# Patient Record
Sex: Female | Born: 1980 | Race: White | Hispanic: No | Marital: Married | Smoking: Former smoker
Health system: Southern US, Community
[De-identification: ages and names within clinical notes are randomized; demographics above are authoritative.]

## PROBLEM LIST (undated history)

## (undated) DIAGNOSIS — K219 Gastro-esophageal reflux disease without esophagitis: Secondary | ICD-10-CM

## (undated) DIAGNOSIS — Z8619 Personal history of other infectious and parasitic diseases: Secondary | ICD-10-CM

## (undated) DIAGNOSIS — T8859XA Other complications of anesthesia, initial encounter: Secondary | ICD-10-CM

## (undated) DIAGNOSIS — M1990 Unspecified osteoarthritis, unspecified site: Secondary | ICD-10-CM

## (undated) DIAGNOSIS — T4145XA Adverse effect of unspecified anesthetic, initial encounter: Secondary | ICD-10-CM

## (undated) DIAGNOSIS — Z1589 Genetic susceptibility to other disease: Secondary | ICD-10-CM

## (undated) DIAGNOSIS — E282 Polycystic ovarian syndrome: Secondary | ICD-10-CM

## (undated) DIAGNOSIS — Z8489 Family history of other specified conditions: Secondary | ICD-10-CM

## (undated) DIAGNOSIS — R51 Headache: Secondary | ICD-10-CM

## (undated) DIAGNOSIS — R519 Headache, unspecified: Secondary | ICD-10-CM

## (undated) DIAGNOSIS — J45909 Unspecified asthma, uncomplicated: Secondary | ICD-10-CM

## (undated) DIAGNOSIS — G4730 Sleep apnea, unspecified: Secondary | ICD-10-CM

## (undated) HISTORY — DX: Personal history of other infectious and parasitic diseases: Z86.19

## (undated) HISTORY — DX: Unspecified asthma, uncomplicated: J45.909

## (undated) HISTORY — DX: Gastro-esophageal reflux disease without esophagitis: K21.9

## (undated) HISTORY — DX: Unspecified osteoarthritis, unspecified site: M19.90

## (undated) HISTORY — PX: REFRACTIVE SURGERY: SHX103

## (undated) HISTORY — DX: Sleep apnea, unspecified: G47.30

---
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
## 2014-12-31 ENCOUNTER — Other Ambulatory Visit: Payer: Self-pay

## 2014-12-31 DIAGNOSIS — Z01818 Encounter for other preprocedural examination: Secondary | ICD-10-CM

## 2014-12-31 DIAGNOSIS — G4733 Obstructive sleep apnea (adult) (pediatric): Secondary | ICD-10-CM

## 2015-02-09 ENCOUNTER — Encounter: Payer: 59 | Attending: Surgery | Admitting: Dietician

## 2015-02-09 ENCOUNTER — Encounter: Payer: Self-pay | Admitting: Dietician

## 2015-02-09 DIAGNOSIS — Z6841 Body Mass Index (BMI) 40.0 and over, adult: Secondary | ICD-10-CM | POA: Diagnosis not present

## 2015-02-09 DIAGNOSIS — Z713 Dietary counseling and surveillance: Secondary | ICD-10-CM | POA: Diagnosis not present

## 2015-02-09 NOTE — Progress Notes (Signed)
  Pre-Op Assessment Visit:  Pre-Operative Sleeve Gastrectomy or RYGB Surgery  Medical Nutrition Therapy:  Appt start time: 1000   End time:  1030.  Patient was seen on 02/09/2015 for Pre-Operative Nutrition Assessment. Assessment and letter of approval faxed to [facility] Bariatric Surgery Program coordinator on 02/09/2015.   Preferred Learning Style:   No preference indicated   Learning Readiness:   Ready  Handouts given during visit include:  Pre-Op Goals Bariatric Surgery Protein Shakes   During the appointment today the following Pre-Op Goals were reviewed with the patient: Maintain or lose weight as instructed by your surgeon Make healthy food choices Begin to limit portion sizes Limited concentrated sugars and fried foods Keep fat/sugar in the single digits per serving on   food labels Practice CHEWING your food  (aim for 30 chews per bite or until applesauce consistency) Practice not drinking 15 minutes before, during, and 30 minutes after each meal/snack Avoid all carbonated beverages  Avoid/limit caffeinated beverages  Avoid all sugar-sweetened beverages Consume 3 meals per day; eat every 3-5 hours Make a list of non-food related activities Aim for 64-100 ounces of FLUID daily  Aim for at least 60-80 grams of PROTEIN daily Look for a liquid protein source that contain ?15 g protein and ?5 g carbohydrate  (ex: shakes, drinks, shots)  Patient-Centered Goals: -More active for kids  Scale of 1-10: confidence(6)/importance (10)  Demonstrated degree of understanding via:  Teach Back  Teaching Method Utilized:  Visual Auditory Hands on  Barriers to learning/adherence to lifestyle change: none  [facility] to call the [facility] to enroll in Pre-Op and Post-Op Nutrition Education when surgery date is scheduled.

## 2015-02-09 NOTE — Patient Instructions (Signed)

## 2015-02-11 ENCOUNTER — Ambulatory Visit (HOSPITAL_COMMUNITY)
Admission: RE | Admit: 2015-02-11 | Discharge: 2015-02-11 | Disposition: A | Discharge status: Home / Self Care | Payer: 59 | Source: Ambulatory Visit | Attending: Surgery | Admitting: Surgery

## 2015-02-11 ENCOUNTER — Other Ambulatory Visit: Payer: Self-pay

## 2015-02-11 DIAGNOSIS — K219 Gastro-esophageal reflux disease without esophagitis: Secondary | ICD-10-CM | POA: Diagnosis not present

## 2015-02-11 DIAGNOSIS — G4733 Obstructive sleep apnea (adult) (pediatric): Secondary | ICD-10-CM | POA: Insufficient documentation

## 2015-02-11 DIAGNOSIS — J45909 Unspecified asthma, uncomplicated: Secondary | ICD-10-CM | POA: Diagnosis not present

## 2015-02-11 DIAGNOSIS — R161 Splenomegaly, not elsewhere classified: Secondary | ICD-10-CM | POA: Diagnosis not present

## 2015-02-11 DIAGNOSIS — K760 Fatty (change of) liver, not elsewhere classified: Secondary | ICD-10-CM | POA: Insufficient documentation

## 2015-02-11 DIAGNOSIS — F418 Other specified anxiety disorders: Secondary | ICD-10-CM | POA: Insufficient documentation

## 2015-02-11 DIAGNOSIS — Z87891 Personal history of nicotine dependence: Secondary | ICD-10-CM | POA: Insufficient documentation

## 2015-02-11 DIAGNOSIS — Z6841 Body Mass Index (BMI) 40.0 and over, adult: Secondary | ICD-10-CM | POA: Diagnosis not present

## 2015-03-17 ENCOUNTER — Encounter: Payer: 59 | Attending: Surgery | Admitting: Dietician

## 2015-03-17 ENCOUNTER — Encounter: Payer: Self-pay | Admitting: Dietician

## 2015-03-17 DIAGNOSIS — Z6841 Body Mass Index (BMI) 40.0 and over, adult: Secondary | ICD-10-CM | POA: Insufficient documentation

## 2015-03-17 DIAGNOSIS — Z713 Dietary counseling and surveillance: Secondary | ICD-10-CM | POA: Diagnosis not present

## 2015-03-17 NOTE — Progress Notes (Signed)
  6 Months Supervised Weight Loss Visit:   Pre-Operative sleeve or RYGB Surgery  Medical Nutrition Therapy:  Appt start time: 1610 end time:  1625.  Primary concerns today: Supervised Weight Loss Visit. Returned with a 5 lbs weight gain since last time. Has had some vacations in the past month. Has tried some protein powder and got some vitamins. Starting to eat 3 meals per day instead of one large meal. Still having fried/sugar in foods. Drinks water, coffee black or with cream, diet soda, and unsweet tea. Has started working on chewing well and not drinking during meals.   Weight: 308.9 lbs BMI: 48.4  Patient-Centered Goals: -More active for kids  Scale of 1-10: confidence(6)/importance (10)  Preferred Learning Style:   No preference indicated   Learning Readiness:   Ready  Recent physical activity:  Going to the gym 3 x week (swimming or weight lifting or walking) for 60 minutes  Progress Towards Goal(s):  In progress.    Nutritional Diagnosis:  [place]-3.3 Obesity related to past poor dietary habits and physical inactivity as evidenced by patient attending supervised weight loss for insurance approval of bariatric surgery.    Intervention:  Nutrition counseling providing. Plan: Plan to cut back on diet soda. Plan to cut back on pasta, fried foods, and sugar. Continue to work on chewing well and not drinking during meals.  Try Premier Protein.   Teaching Method Utilized:  Visual Auditory Hands on  Barriers to learning/adherence to lifestyle change: none  Demonstrated degree of understanding via:  Teach Back   Monitoring/Evaluation:  Dietary intake, exercise, and body weight. Follow up in 1 months for 6 month supervised weight loss visit.

## 2015-03-17 NOTE — Patient Instructions (Addendum)
Plan to cut back on diet soda. Plan to cut back on pasta, fried foods, and sugar. Continue to work on chewing well and not drinking during meals.  Try Premier Protein.

## 2015-03-30 ENCOUNTER — Ambulatory Visit: Payer: 59 | Admitting: Family

## 2015-03-31 ENCOUNTER — Encounter: Payer: Self-pay | Admitting: Family

## 2015-03-31 ENCOUNTER — Ambulatory Visit (INDEPENDENT_AMBULATORY_CARE_PROVIDER_SITE_OTHER): Payer: 59 | Admitting: Family

## 2015-03-31 VITALS — BP 120/82 | HR 83 | Temp 97.9°F | Resp 18 | Ht 67.5 in | Wt 304.6 lb

## 2015-03-31 DIAGNOSIS — Z0000 Encounter for general adult medical examination without abnormal findings: Secondary | ICD-10-CM

## 2015-03-31 DIAGNOSIS — J45990 Exercise induced bronchospasm: Secondary | ICD-10-CM

## 2015-03-31 DIAGNOSIS — R635 Abnormal weight gain: Secondary | ICD-10-CM | POA: Diagnosis not present

## 2015-03-31 DIAGNOSIS — F329 Major depressive disorder, single episode, unspecified: Secondary | ICD-10-CM

## 2015-03-31 DIAGNOSIS — E282 Polycystic ovarian syndrome: Secondary | ICD-10-CM | POA: Diagnosis not present

## 2015-03-31 DIAGNOSIS — F419 Anxiety disorder, unspecified: Secondary | ICD-10-CM

## 2015-03-31 DIAGNOSIS — F418 Other specified anxiety disorders: Secondary | ICD-10-CM

## 2015-03-31 DIAGNOSIS — G4733 Obstructive sleep apnea (adult) (pediatric): Secondary | ICD-10-CM

## 2015-03-31 DIAGNOSIS — G43809 Other migraine, not intractable, without status migrainosus: Secondary | ICD-10-CM

## 2015-03-31 LAB — BASIC METABOLIC PANEL
BUN: 11 mg/dL (ref 6–23)
CO2: 30 mEq/L (ref 19–32)
CREATININE: 0.74 mg/dL (ref 0.40–1.20)
Calcium: 9.8 mg/dL (ref 8.4–10.5)
Chloride: 101 mEq/L (ref 96–112)
GFR: 96.7 mL/min (ref >60.00)
Glucose, Bld: 94 mg/dL (ref 70–99)
Potassium: 4 mEq/L (ref 3.5–5.1)
Sodium: 136 mEq/L (ref 135–145)

## 2015-03-31 LAB — TSH: TSH: 2.42 u[IU]/mL (ref 0.35–4.50)

## 2015-03-31 LAB — HEMOGLOBIN A1C: HEMOGLOBIN A1C: 5.1 % (ref 4.6–6.5)

## 2015-03-31 MED ORDER — METFORMIN HCL 500 MG PO TABS
500.0000 mg | ORAL_TABLET | Freq: Every day | ORAL | Status: DC
Start: 2015-03-31 — End: 2016-05-25

## 2015-03-31 MED ORDER — ALBUTEROL SULFATE HFA 108 (90 BASE) MCG/ACT IN AERS: 2.0000 | INHALATION_SPRAY | Freq: Four times a day (QID) | RESPIRATORY_TRACT | Status: AC | PRN

## 2015-03-31 NOTE — Patient Instructions (Addendum)
Please complete lab work prior to leaving. Schedule a complete physical at the front desk. Welcome to [place]! 

## 2015-03-31 NOTE — Progress Notes (Signed)
Subjective:    Patient ID: Kaylee Roberts, female    DOB: 04/30/1981, 34 y.o.   MRN: [id number]  HPI  Kaylee Roberts is a 34 yr old female who presents today to establish care.  Anxiety/depression- moved from [place] in November. Notes that anxiety > depression. Was treated with prozac post partum.    OSA- She does not have a CPAP, was intolerant.    Migraine- reports monthly migraines- seems to be worse with rainey weather.  Uses excedrin migraine which usually helps.    Asthma- Exercise induced, uses albuterol prn.   PCOS- not having menstrual cycles.    Morbid obesity- planning gastric bypass.   She reports that she had strep in May. Took abx x 10 days.  Tonsils feel full since that time.  Occasional tonsilar irritation.   Review of Systems  Constitutional:       Has gained 20 pounds in the last 6 months  HENT: Negative for hearing loss and rhinorrhea.   Eyes: Negative for visual disturbance.  Respiratory: Negative for cough and shortness of breath.   Cardiovascular: Negative for chest pain.  Gastrointestinal: Negative for diarrhea and constipation.  Genitourinary: Negative for dysuria and frequency.  Musculoskeletal: Negative for myalgias.       Reports arthitis in knees and ankles  Skin: Negative for rash.  Neurological: Positive for headaches.  Hematological: Negative for adenopathy.  Psychiatric/Behavioral:       See HPI   Past Medical History  Diagnosis Date  . Anxiety   . Depression   . GERD (gastroesophageal reflux disease)   . Sleep apnea   . Asthma     exercise induced asthma  . History of chicken pox   . Osteoarthritis   . History of migraine     Social History   Social History  . Marital Status: Married    Spouse Name: N/A  . Number of Children: N/A  . Years of Education: N/A   Occupational History  . Not on file.   Social History Main Topics  . Smoking status: Never Smoker   . Smokeless tobacco: Not on file  . Alcohol Use: No  . Drug  Use: No  . Sexual Activity: Not on file   Other Topics Concern  . Not on file   Social History Narrative   2 sons born 2010 and 2013   Stay at home mom   Married   Enjoys painting/crafting, spending time with friends.  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    Completed college    Past Surgical History  Procedure Laterality Date  . Cesarean section  2010 & 2013  . Fracture surgery  1990    nasal    Family History  Problem Relation Age of Onset  . Arthritis Mother   . Diabetes Mother   . Hypertension Father   . Bleeding Disorder Sister   . Depression Sister   . Arthritis Maternal Grandmother   . Stroke Maternal Grandmother   . Alcohol abuse Maternal Grandfather   . Diabetes Maternal Grandfather   . Hyperlipidemia Paternal Grandmother   . Heart disease Paternal Grandmother   . Hypertension Paternal Grandmother   . Hyperlipidemia Paternal Grandfather   . Heart disease Paternal Grandfather   . Hypertension Paternal Grandfather     Allergies  Allergen Reactions  . Allegra [Fexofenadine] Palpitations and Rash  . Claritin [Loratadine] Palpitations and Rash  . Tetracyclines & Related Palpitations and Rash    Current Outpatient Prescriptions on File Prior to  Visit  Medication Sig Dispense Refill  . metFORMIN (GLUCOPHAGE) 500 MG tablet Take by mouth daily with breakfast.      No current facility-administered medications on file prior to visit.    BP 120/82 mmHg  Pulse 83  Temp(Src) 97.9 F (36.6 C) (Oral)  Resp 18  Ht 5' 7.5" (1.715 m)  Wt 304 lb 9.6 oz (138.166 kg)  BMI 46.98 kg/m2  SpO2 98%  LMP 02/01/2015       Objective:   Physical Exam  Constitutional: She is oriented to person, place, and time. She appears well-developed and well-nourished.  Morbidly obese white female  HENT:  Head: Normocephalic and atraumatic.  Right Ear: Tympanic membrane and ear canal normal.  Left Ear: Tympanic membrane and ear canal normal.  Mouth/Throat: No oropharyngeal  exudate, posterior oropharyngeal edema, posterior oropharyngeal erythema or tonsillar abscesses.  2+ tonsils  Cardiovascular: Normal rate, regular rhythm and normal heart sounds.   No murmur heard. Pulmonary/Chest: Effort normal and breath sounds normal. No respiratory distress. She has no wheezes.  Musculoskeletal: She exhibits no edema.  Neurological: She is alert and oriented to person, place, and time.  Skin: Skin is warm and dry.  Psychiatric: She has a normal mood and affect. Her behavior is normal. Judgment and thought content normal.          Assessment & Plan:  Weight Gain- obtain TSH

## 2015-04-02 ENCOUNTER — Encounter: Payer: Self-pay | Admitting: Family

## 2015-04-02 DIAGNOSIS — F32A Depression, unspecified: Secondary | ICD-10-CM | POA: Insufficient documentation

## 2015-04-02 DIAGNOSIS — E282 Polycystic ovarian syndrome: Secondary | ICD-10-CM | POA: Insufficient documentation

## 2015-04-02 DIAGNOSIS — G4733 Obstructive sleep apnea (adult) (pediatric): Secondary | ICD-10-CM | POA: Insufficient documentation

## 2015-04-02 DIAGNOSIS — J45990 Exercise induced bronchospasm: Secondary | ICD-10-CM | POA: Insufficient documentation

## 2015-04-02 DIAGNOSIS — F419 Anxiety disorder, unspecified: Secondary | ICD-10-CM

## 2015-04-02 DIAGNOSIS — G43909 Migraine, unspecified, not intractable, without status migrainosus: Secondary | ICD-10-CM | POA: Insufficient documentation

## 2015-04-02 DIAGNOSIS — F329 Major depressive disorder, single episode, unspecified: Secondary | ICD-10-CM | POA: Insufficient documentation

## 2015-04-02 NOTE — Assessment & Plan Note (Signed)
Stable prn excedrin migraine

## 2015-04-02 NOTE — Assessment & Plan Note (Signed)
Stable with prn albuterol.  

## 2015-04-02 NOTE — Assessment & Plan Note (Addendum)
Attempted CPAP and was intolerant. Wishes to focus on weight loss

## 2015-04-02 NOTE — Assessment & Plan Note (Signed)
She does not wish to start any medication at this time.  Monitor.

## 2015-04-02 NOTE — Assessment & Plan Note (Signed)
Amenorrhea, will refer to GYN. Obtain A1C, continue metformin.

## 2015-04-02 NOTE — Assessment & Plan Note (Signed)
She is seeking bariatric surgery and hopes to have done in a few months.

## 2015-04-15 ENCOUNTER — Ambulatory Visit: Payer: 59 | Admitting: Dietician

## 2015-04-19 ENCOUNTER — Encounter: Payer: Self-pay | Admitting: Dietician

## 2015-04-19 ENCOUNTER — Encounter: Payer: 59 | Attending: Surgery | Admitting: Dietician

## 2015-04-19 DIAGNOSIS — Z713 Dietary counseling and surveillance: Secondary | ICD-10-CM | POA: Insufficient documentation

## 2015-04-19 DIAGNOSIS — Z6841 Body Mass Index (BMI) 40.0 and over, adult: Secondary | ICD-10-CM | POA: Insufficient documentation

## 2015-04-19 NOTE — Patient Instructions (Addendum)
Continue to cut back on diet soda. Continue cutting out caffeine. Continue to cut back on carbs. Continue to work on chewing well and not drinking during meals.  Try Premier Protein. Think about packing (or powder) for vacation. Continue exercising about 3 x week.

## 2015-04-19 NOTE — Progress Notes (Signed)
  6 Months Supervised Weight Loss Visit:   Pre-Operative sleeve or RYGB Surgery  Medical Nutrition Therapy:  Appt start time: 1430 end time:  1445.  Primary concerns today: Supervised Weight Loss Visit #2. Returned with a 5 lbs weight loss since last time. Getting ready to go to [place] on vacation. Cut back on soda a lot. Working on cutting back on eating at night. Still having 3 meals a day. Has cut back some on coffee. Has cut on fried food for the most back. Doesn't have too much sugar.   Has started working on chewing well and not drinking during meals.   Weight: 308.9 lbs BMI: 48.4  Patient-Centered Goals: -More active for kids  Scale of 1-10: confidence(6)/importance (10)  Preferred Learning Style:   No preference indicated   Learning Readiness:   Ready  Recent physical activity:  Going to the gym 3 x week (swimming or weight lifting or walking) for 60 minutes  Progress Towards Goal(s):  In progress.    Nutritional Diagnosis:  [place]-3.3 Obesity related to past poor dietary habits and physical inactivity as evidenced by patient attending supervised weight loss for insurance approval of bariatric surgery.    Intervention:  Nutrition counseling providing. Plan: Continue to cut back on diet soda. Continue cutting out caffeine. Continue to cut back on carbs. Continue to work on chewing well and not drinking during meals.  Try Premier Protein. Think about packing (or powder) for vacation. Continue exercising about 3 x week.   Teaching Method Utilized:  Visual Auditory Hands on  Barriers to learning/adherence to lifestyle change: none  Demonstrated degree of understanding via:  Teach Back   Monitoring/Evaluation:  Dietary intake, exercise, and body weight. Follow up in 1 months for 6 month supervised weight loss visit.

## 2015-05-17 ENCOUNTER — Encounter: Payer: Self-pay | Admitting: Dietician

## 2015-05-17 ENCOUNTER — Encounter: Payer: 59 | Attending: Surgery | Admitting: Dietician

## 2015-05-17 ENCOUNTER — Ambulatory Visit: Payer: 59 | Admitting: Dietician

## 2015-05-17 DIAGNOSIS — Z6841 Body Mass Index (BMI) 40.0 and over, adult: Secondary | ICD-10-CM | POA: Insufficient documentation

## 2015-05-17 DIAGNOSIS — Z713 Dietary counseling and surveillance: Secondary | ICD-10-CM | POA: Diagnosis not present

## 2015-05-17 NOTE — Progress Notes (Signed)
Patient was seen on 05/17/2015 for the "Mindful Eating" Supervised Weight Loss Class at the [facility]. The following learning objectives were met by the patient during this class:  Objectives: -Discuss masticating foods to appropriate consistency  -Helps food fit in pouch and prevents pain and vomiting  -Eating slowly and taking tiny bites help brain register fullness -Encourage patients to begin to think about what diet will be like after surgery -Encourage patients to begin to consider portion sizes and listen to hunger/fullness cues -Review importance of regular meals and snacks -Discuss diet advancement after surgery -Review causes and symptoms of dumping syndrome  Goals: -Begin limiting fried and other high fat foods and foods high in sugar -Eat proteins first, then vegetables, then starch -Practice chewing foods to applesauce consistency -Aim to spend 20 minutes eating meals -Work on eating 3 meals a day (or every 3-5 hours) if you are not already doing so  Handouts given: "Mindful Eating" pamphlet 

## 2015-06-14 ENCOUNTER — Encounter: Payer: 59 | Attending: Surgery | Admitting: Dietician

## 2015-06-14 DIAGNOSIS — Z713 Dietary counseling and surveillance: Secondary | ICD-10-CM | POA: Diagnosis not present

## 2015-06-14 DIAGNOSIS — Z6841 Body Mass Index (BMI) 40.0 and over, adult: Secondary | ICD-10-CM | POA: Diagnosis not present

## 2015-06-15 ENCOUNTER — Encounter: Payer: Self-pay | Admitting: Dietician

## 2015-06-15 NOTE — Progress Notes (Signed)
Supervised Weight Loss Class:  Appt start time: 1600   End time:  1630.  Patient was seen on 06/14/2015 for the "Protein" Supervised Weight Loss Class at the [facility].   Surgery type:  Start weight at [facility]: 303 lbs on 02/09/15 Weight today: 303.3 lbs Weight change: 0 lbs   The following learning objectives were met by the patient during this class:  Objectives: -Discuss importance of protein after surgery  -Promotes healing  -Helps preserve lean mass (muscles and organs) as you lose fat -Identify foods that contain protein -Discuss recommended daily protein goals for men and women after surgery -Educate patients on body composition scale and purpose   Goals: -Begin to be mindful of how much protein is in the foods you are eating -Know how much protein is recommended daily after surgery -Begin having a protein food with each meal and snack  Handouts given: "Protein" brochure

## 2015-07-19 ENCOUNTER — Encounter: Payer: 59 | Attending: Surgery | Admitting: Dietician

## 2015-07-19 DIAGNOSIS — Z713 Dietary counseling and surveillance: Secondary | ICD-10-CM | POA: Diagnosis not present

## 2015-07-19 DIAGNOSIS — Z6841 Body Mass Index (BMI) 40.0 and over, adult: Secondary | ICD-10-CM | POA: Insufficient documentation

## 2015-07-21 ENCOUNTER — Encounter: Payer: Self-pay | Admitting: Dietician

## 2015-07-21 NOTE — Progress Notes (Signed)
Supervised Weight Loss Class:  Appt start time: 1600   End time:  1630.  Patient was seen on 07/19/2015 for the "Protein Shakes" Supervised Weight Loss Class at the [facility].    Surgery type:  Start weight at [facility]: 303 lbs on 02/09/15 Weight today: 304.9 lbs Weight change: 1.6 lbs gain  The following learning objectives were met by the patient during this class:  Objectives: -Review when protein shakes will be recommended in the surgery process -Identify criteria for acceptable protein shakes -Provide examples of acceptable and unacceptable protein shakes -Remind patients that taste preferences may change after surgery  Goals: -Find 1 or more protein shakes that meet the criteria  -Look for a shake that is acceptable for taste and budget  Handouts given: Protein shakes brochure

## 2015-08-16 ENCOUNTER — Encounter: Payer: 59 | Attending: Surgery | Admitting: Dietician

## 2015-08-16 DIAGNOSIS — Z713 Dietary counseling and surveillance: Secondary | ICD-10-CM | POA: Diagnosis not present

## 2015-08-16 DIAGNOSIS — Z6841 Body Mass Index (BMI) 40.0 and over, adult: Secondary | ICD-10-CM | POA: Insufficient documentation

## 2015-08-17 ENCOUNTER — Encounter: Payer: Self-pay | Admitting: Dietician

## 2015-08-17 ENCOUNTER — Telehealth: Payer: Self-pay | Admitting: Family

## 2015-08-17 NOTE — Telephone Encounter (Signed)
Caller name: [place]  Relationship to patient: Self  Can be reached: [phone]  Reason for call: Pt called in stating that she need a letter of necessity to provide to her insurance. Pt says that she is getting gastro surgery.

## 2015-08-17 NOTE — Progress Notes (Signed)
Supervised Weight Loss Class:  Appt start time: 1600   End time:  1630.  Patient was seen on 08/16/2015 for the "Fluid" Supervised Weight Loss Class at the [facility].   Surgery type:  Start weight at [facility]: 303 lbs on 02/09/15 Weight today: 304.2 lbs Weight change: 1 lbs loss  Objectives: -Discuss importance of fluid and hydration status after surgery -Identify appropriate fluids after surgery -Discuss importance of limiting caffeine in the perioperative period -Review importance of avoiding drinking while eating  Goals: -Begin to wean off of caffeine -Practice avoiding fluids 15 minutes before, during, and 30 minutes after meals -Begin to work your way up to 64 ounces of sugar free, caffeine free, non-carbonated fluids  Handouts given: "Fluid" handout

## 2015-08-18 ENCOUNTER — Encounter: Payer: Self-pay | Admitting: Family

## 2015-08-18 NOTE — Telephone Encounter (Signed)
See letter.

## 2015-08-18 NOTE — Telephone Encounter (Signed)
Left message for pt to return my call.

## 2015-08-18 NOTE — Telephone Encounter (Signed)
Pt called back stating she has to get approval from insurance before she can proceed with gastric bypass. Reports that she completed 6 months of nutrition classes / counseling through [employer]. Followed low cab diet and stopped all caffeine. Exercised 3 x weekly (1 hr each time), did cardio and weights. Weight remains 304 per pt. Pt needs letter to state health conditions: OSA, PCOS, asthma. Needs to contain BMI and note that pt has not been under PCP's care for 5 years as she is new to this area and just established with [place] this past year. Please advise.

## 2015-08-19 NOTE — Telephone Encounter (Signed)
Left a message making patient aware that letter has been printed and placed up front for pick up.  She was encouraged to call back with questions or concerns.

## 2015-10-04 ENCOUNTER — Encounter: Payer: 59 | Attending: Surgery

## 2015-10-04 DIAGNOSIS — Z713 Dietary counseling and surveillance: Secondary | ICD-10-CM | POA: Diagnosis not present

## 2015-10-04 DIAGNOSIS — Z6841 Body Mass Index (BMI) 40.0 and over, adult: Secondary | ICD-10-CM | POA: Diagnosis not present

## 2015-10-06 NOTE — Progress Notes (Signed)
  Pre-Operative Nutrition Class:  Appt start time: 3709   End time:  1830.  Patient was seen on 10/04/2015 for Pre-Operative Bariatric Surgery Education at the [facility].   Surgery date: 10/26/2015 Surgery type: RYGB Start weight at [facility]: 303 lbs on 02/09/2015 Weight today: 305.5 lbs  TANITA  BODY COMP RESULTS  10/04/15   BMI (kg/m^2) 47.8   Fat Mass (lbs) 159   Fat Free Mass (lbs) 146.5   Total Body Water (lbs) 107   Samples given per MNT protocol. Patient educated on appropriate usage: Unjury protein powder (strawberry - qty 1) Lot #: [id number] Exp: 05/2016  Premier protein shake (vanilla - qty 1) Lot #: [id number] Exp:12/2015  Celebrate Vitamins Multivitamin (black cherry - qty 1) Lot #: [id number] Exp: 02/2017  Bariatric Advantage Calcium Citrate chew (tropical orange - qty 1) Lot #: [id number] Exp: 02/2016  The following the learning objectives were met by the patient during this course:  Identify Pre-Op Dietary Goals and will begin 2 weeks pre-operatively  Identify appropriate sources of fluids and proteins   State protein recommendations and appropriate sources pre and post-operatively  Identify Post-Operative Dietary Goals and will follow for 2 weeks post-operatively  Identify appropriate multivitamin and calcium sources  Describe the need for physical activity post-operatively and will follow MD recommendations  State when to call healthcare provider regarding medication questions or post-operative complications  Handouts given during class include:  Pre-Op Bariatric Surgery Diet Handout  Protein Shake Handout  Post-Op Bariatric Surgery Nutrition Handout  BELT Program Information Flyer  Support Group Information Flyer  [facility] Bariatric Supplements Price List  Follow-Up Plan: Patient will follow-up at [facility] 2 weeks post operatively for diet advancement per MD.

## 2015-10-11 ENCOUNTER — Ambulatory Visit: Payer: Self-pay | Admitting: Surgery

## 2015-10-13 ENCOUNTER — Encounter (HOSPITAL_COMMUNITY): Payer: Self-pay

## 2015-10-13 ENCOUNTER — Encounter (HOSPITAL_COMMUNITY)
Admission: RE | Admit: 2015-10-13 | Discharge: 2015-10-13 | Disposition: A | Discharge status: Home / Self Care | Payer: 59 | Source: Ambulatory Visit | Attending: Surgery | Admitting: Surgery

## 2015-10-13 DIAGNOSIS — Z01812 Encounter for preprocedural laboratory examination: Secondary | ICD-10-CM | POA: Insufficient documentation

## 2015-10-13 HISTORY — DX: Adverse effect of unspecified anesthetic, initial encounter: T41.45XA

## 2015-10-13 HISTORY — DX: Headache, unspecified: R51.9

## 2015-10-13 HISTORY — DX: Headache: R51

## 2015-10-13 HISTORY — DX: Other complications of anesthesia, initial encounter: T88.59XA

## 2015-10-13 HISTORY — DX: Family history of other specified conditions: Z84.89

## 2015-10-13 HISTORY — DX: Polycystic ovarian syndrome: E28.2

## 2015-10-13 HISTORY — DX: Genetic susceptibility to other disease: Z15.89

## 2015-10-13 LAB — COMPREHENSIVE METABOLIC PANEL
ALBUMIN: 4.2 g/dL (ref 3.5–5.0)
ALT: 32 U/L (ref 14–54)
AST: 21 U/L (ref 15–41)
Alkaline Phosphatase: 55 U/L (ref 38–126)
Anion gap: 8 (ref 5–15)
BUN: 11 mg/dL (ref 6–20)
CHLORIDE: 103 mmol/L (ref 101–111)
CO2: 29 mmol/L (ref 22–32)
CREATININE: 0.75 mg/dL (ref 0.44–1.00)
Calcium: 9.7 mg/dL (ref 8.9–10.3)
GFR calc Af Amer: >60 mL/min (ref >60)
GLUCOSE: 123 mg/dL — AB (ref 65–99)
POTASSIUM: 4 mmol/L (ref 3.5–5.1)
SODIUM: 140 mmol/L (ref 135–145)
Total Bilirubin: 0.4 mg/dL (ref 0.3–1.2)
Total Protein: 8.1 g/dL (ref 6.5–8.1)

## 2015-10-13 LAB — CBC WITH DIFFERENTIAL/PLATELET
BASOS ABS: 0 10*3/uL (ref 0.0–0.1)
BASOS PCT: 0 %
EOS ABS: 0.2 10*3/uL (ref 0.0–0.7)
Eosinophils Relative: 2 %
HCT: 42 % (ref 36.0–46.0)
Hemoglobin: 13.3 g/dL (ref 12.0–15.0)
LYMPHS PCT: 31 %
Lymphs Abs: 2.8 10*3/uL (ref 0.7–4.0)
MCH: 26.5 pg (ref 26.0–34.0)
MCHC: 31.7 g/dL (ref 30.0–36.0)
MCV: 83.7 fL (ref 78.0–100.0)
MONO ABS: 0.3 10*3/uL (ref 0.1–1.0)
Monocytes Relative: 3 %
Neutro Abs: 5.9 10*3/uL (ref 1.7–7.7)
Neutrophils Relative %: 64 %
PLATELETS: 280 10*3/uL (ref 150–400)
RBC: 5.02 MIL/uL (ref 3.87–5.11)
RDW: 15.1 % (ref 11.5–15.5)
WBC: 9.3 10*3/uL (ref 4.0–10.5)

## 2015-10-13 NOTE — Patient Instructions (Addendum)
YOUR PROCEDURE IS SCHEDULED ON : 10/26/15  REPORT TO [facility] MAIN ENTRANCE FOLLOW SIGNS TO EAST ELEVATOR - GO TO 3rd FLOOR CHECK IN AT 3 EAST NURSES STATION (SHORT STAY) AT:  5:15 am  CALL THIS NUMBER IF YOU HAVE PROBLEMS THE MORNING OF SURGERY [phone]  REMEMBER:ONLY 1 PER PERSON MAY GO TO SHORT STAY WITH YOU TO GET READY THE MORNING OF YOUR SURGERY  DO NOT EAT FOOD OR DRINK LIQUIDS AFTER MIDNIGHT  TAKE THESE MEDICINES THE MORNING OF SURGERY: none  YOU MAY NOT HAVE ANY METAL ON YOUR BODY INCLUDING HAIR PINS AND PIERCING'S. DO NOT WEAR JEWELRY, MAKEUP, LOTIONS, POWDERS OR PERFUMES. DO NOT WEAR NAIL POLISH. DO NOT SHAVE 48 HRS PRIOR TO SURGERY. MEN MAY SHAVE FACE AND NECK.  DO NOT BRING VALUABLES TO HOSPITAL. [facility] IS NOT RESPONSIBLE FOR VALUABLES.  CONTACTS, DENTURES OR PARTIALS MAY NOT BE WORN TO SURGERY. LEAVE SUITCASE IN CAR. CAN BE BROUGHT TO ROOM AFTER SURGERY.  PATIENTS DISCHARGED THE DAY OF SURGERY WILL NOT BE ALLOWED TO DRIVE HOME.  PLEASE READ OVER THE FOLLOWING INSTRUCTION SHEETS _________________________________________________________________________________                                          [facility] - PREPARING FOR SURGERY  Before surgery, you can play an important role.  Because skin is not sterile, your skin needs to be as free of germs as possible.  You can reduce the number of germs on your skin by washing with CHG (chlorahexidine gluconate) soap before surgery.  CHG is an antiseptic cleaner which kills germs and bonds with the skin to continue killing germs even after washing. Please DO NOT use if you have an allergy to CHG or antibacterial soaps.  If your skin becomes reddened/irritated stop using the CHG and inform your nurse when you arrive at Short Stay. Do not shave (including legs and underarms) for at least 48 hours prior to the first CHG shower.  You may shave your face. Please follow these instructions  carefully:   1.  Shower with CHG Soap the night before surgery and the  morning of Surgery.   2.  If you choose to wash your hair, wash your hair first as usual with your  normal  Shampoo.   3.  After you shampoo, rinse your hair and body thoroughly to remove the  shampoo.                                         4.  Use CHG as you would any other liquid soap.  You can apply chg directly  to the skin and wash . Gently wash with scrungie or clean wascloth    5.  Apply the CHG Soap to your body ONLY FROM THE NECK DOWN.   Do not use on open                           Wound or open sores. Avoid contact with eyes, ears mouth and genitals (private parts).                        Genitals (private parts) with your normal soap.  6.  Wash thoroughly, paying special attention to the area where your surgery  will be performed.   7.  Thoroughly rinse your body with warm water from the neck down.   8.  DO NOT shower/wash with your normal soap after using and rinsing off  the CHG Soap .                9.  Pat yourself dry with a clean towel.             10.  Wear clean night clothes to bed after shower             11.  Place clean sheets on your bed the night of your first shower and do not  sleep with pets.  Day of Surgery : Do not apply any lotions/deodorants the morning of surgery.  Please wear clean clothes to the hospital/surgery center.  FAILURE TO FOLLOW THESE INSTRUCTIONS MAY RESULT IN THE CANCELLATION OF YOUR SURGERY    PATIENT SIGNATURE_________________________________  ______________________________________________________________________

## 2015-10-20 ENCOUNTER — Ambulatory Visit: Payer: Self-pay | Admitting: Surgery

## 2015-10-20 NOTE — H&P (Addendum)
Chief Complaint:  For roux en Y gastric bypass  History of Present Illness:  Kaylee Roberts is an 35 y.o. female who has three family members that had bariatric surgery in [place].  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  She decided to have a roux en y gastric bypass.  UGI and ultrasound were negative.    Past Medical History  Diagnosis Date  . GERD (gastroesophageal reflux disease)   . Asthma     exercise induced asthma  . History of chicken pox   . Osteoarthritis   . Complication of anesthesia     "i cry a lot"  . Family history of adverse reaction to anesthesia     mom gets combative  . Headache     hx migraines  . H/O MTHFR mutation   . PCOS (polycystic ovarian syndrome)   . Sleep apnea     unable to tolerate mask    Past Surgical History  Procedure Laterality Date  . Cesarean section  2010 & 2013  . Fracture surgery  1990    nasal  . Refractive surgery      Current Outpatient Prescriptions  Medication Sig Dispense Refill  . albuterol (PROVENTIL HFA;VENTOLIN HFA) 108 (90 BASE) MCG/ACT inhaler Inhale 2 puffs into the lungs every 6 (six) hours as needed for wheezing or shortness of breath. 1 Inhaler 2  . BIOTIN PO Take 1 tablet by mouth daily.     . ibuprofen (ADVIL,MOTRIN) 200 MG tablet Take 600 mg by mouth every 6 (six) hours as needed.    . metFORMIN (GLUCOPHAGE) 500 MG tablet Take 1 tablet (500 mg total) by mouth daily with breakfast. (Patient not taking: Reported on 10/08/2015) 90 tablet 1  . Multiple Vitamin (MULTIVITAMIN WITH MINERALS) TABS tablet Take 1 tablet by mouth daily.    . progesterone (PROMETRIUM) 200 MG capsule Take 200 mg by mouth at bedtime. Day 14-28 of cycle. Then stop until day 14 again.  11   No current facility-administered medications for this visit.   Allegra; Claritin; and Tetracyclines & related Family History  Problem Relation Age of Onset  . Arthritis  Mother   . Diabetes Mother   . Hypertension Father   . Bleeding Disorder Sister   . Depression Sister   . Arthritis Maternal Grandmother   . Stroke Maternal Grandmother   . Alcohol abuse Maternal Grandfather   . Diabetes Maternal Grandfather   . Hyperlipidemia Paternal Grandmother   . Heart disease Paternal Grandmother   . Hypertension Paternal Grandmother   . Hyperlipidemia Paternal Grandfather   . Heart disease Paternal Grandfather   . Hypertension Paternal Grandfather    Social History:   reports that she quit smoking about 10 years ago. She does not have any smokeless tobacco history on file. She reports that she does not drink alcohol or use illicit drugs.   REVIEW OF SYSTEMS : Negative except for see problem list  Physical Exam:   Last menstrual period 08/30/2015. There is no weight on file to calculate BMI.  Gen:  WDWN WF NAD  Neurological: Alert and oriented to person, place, and time. Motor and sensory function is grossly intact  Head: Normocephalic and atraumatic.  Eyes: Conjunctivae are normal. Pupils are equal, round, and reactive to light. No scleral icterus.  Neck: Normal range of motion. Neck supple. No tracheal deviation or thyromegaly present.  Cardiovascular:  SR without murmurs or gallops.  No carotid bruits Breast:    Not examined Respiratory: Effort normal.  No respiratory distress. No chest wall tenderness. Breath sounds normal.  No wheezes, rales or rhonchi.  Abdomen:  nontender GU:  Not examined Musculoskeletal: Normal range of motion. Extremities are nontender. No cyanosis, edema or clubbing noted Lymphadenopathy: No cervical, preauricular, postauricular or axillary adenopathy is present Skin: Skin is warm and dry. No rash noted. No diaphoresis. No erythema. No pallor. Pscyh: Normal mood and affect. Behavior is normal. Judgment and thought content normal.   LABORATORY RESULTS: No results found for this or any previous visit (from the past 48  hour(s)).   RADIOLOGY RESULTS: No results found.  Problem List: Patient Active Problem List   Diagnosis Date Noted  . Anxiety and depression 04/02/2015  . OSA (obstructive sleep apnea) 04/02/2015  . Morbid obesity (HCC) 04/02/2015  . PCOS (polycystic ovarian syndrome) 04/02/2015  . Exercise-induced asthma 04/02/2015  . Migraine 04/02/2015    Assessment & Plan: Morbid obesity (BMI 46)  for roux en y gastric bypass    Matt B. Kim Lauver, MD, FACS  [facility] [phone] beeper [phone]  10/20/2015 3:50 PM      

## 2015-10-25 NOTE — Anesthesia Preprocedure Evaluation (Addendum)
Anesthesia Evaluation  Patient identified by MRN, date of birth, ID band Patient awake    Reviewed: Allergy & Precautions, NPO status , Patient's Chart, lab work & pertinent test results  History of Anesthesia Complications (+) PONV and history of anesthetic complications ("tearful during anesthesia")  Airway Mallampati: II  TM Distance: >3 FB Neck ROM: Full    Dental no notable dental hx. (+) Poor Dentition, Chipped, Dental Advisory Given   Pulmonary asthma , sleep apnea , former smoker,    Pulmonary exam normal breath sounds clear to auscultation       Cardiovascular negative cardio ROS Normal cardiovascular exam Rhythm:Regular Rate:Normal     Neuro/Psych  Headaches, PSYCHIATRIC DISORDERS Anxiety Depression    GI/Hepatic Neg liver ROS, GERD  Medicated and Controlled,  Endo/Other  Morbid obesity  Renal/GU negative Renal ROS  negative genitourinary   Musculoskeletal  (+) Arthritis ,   Abdominal (+) + obese,   Peds negative pediatric ROS (+)  Hematology negative hematology ROS (+)   Anesthesia Other Findings   Reproductive/Obstetrics negative OB ROS                            Anesthesia Physical Anesthesia Plan  ASA: III  Anesthesia Plan: General   Post-op Pain Management:    Induction: Intravenous  Airway Management Planned: Oral ETT  Additional Equipment:   Intra-op Plan:   Post-operative Plan: Extubation in OR  Informed Consent: I have reviewed the patients History and Physical, chart, labs and discussed the procedure including the risks, benefits and alternatives for the proposed anesthesia with the patient or authorized representative who has indicated his/her understanding and acceptance.   Dental advisory given  Plan Discussed with: CRNA  Anesthesia Plan Comments:         Anesthesia Quick Evaluation

## 2015-10-26 ENCOUNTER — Inpatient Hospital Stay (HOSPITAL_COMMUNITY): Payer: 59 | Admitting: Anesthesiology

## 2015-10-26 ENCOUNTER — Encounter (HOSPITAL_COMMUNITY)
Admission: RE | Disposition: A | Discharge status: Home / Self Care | Payer: Self-pay | Source: Ambulatory Visit | Attending: Surgery

## 2015-10-26 ENCOUNTER — Inpatient Hospital Stay (HOSPITAL_COMMUNITY)
Admission: RE | Admit: 2015-10-26 | Discharge: 2015-10-28 | DRG: 621 | Disposition: A | Discharge status: Home / Self Care | Payer: 59 | Source: Ambulatory Visit | Attending: Surgery | Admitting: Surgery

## 2015-10-26 ENCOUNTER — Encounter (HOSPITAL_COMMUNITY): Payer: Self-pay | Admitting: *Deleted

## 2015-10-26 DIAGNOSIS — M1990 Unspecified osteoarthritis, unspecified site: Secondary | ICD-10-CM | POA: Diagnosis present

## 2015-10-26 DIAGNOSIS — Z833 Family history of diabetes mellitus: Secondary | ICD-10-CM

## 2015-10-26 DIAGNOSIS — G4733 Obstructive sleep apnea (adult) (pediatric): Secondary | ICD-10-CM | POA: Diagnosis present

## 2015-10-26 DIAGNOSIS — K219 Gastro-esophageal reflux disease without esophagitis: Secondary | ICD-10-CM | POA: Diagnosis present

## 2015-10-26 DIAGNOSIS — Z8249 Family history of ischemic heart disease and other diseases of the circulatory system: Secondary | ICD-10-CM | POA: Diagnosis not present

## 2015-10-26 DIAGNOSIS — J45909 Unspecified asthma, uncomplicated: Secondary | ICD-10-CM | POA: Diagnosis present

## 2015-10-26 DIAGNOSIS — Z9884 Bariatric surgery status: Secondary | ICD-10-CM

## 2015-10-26 DIAGNOSIS — Z6841 Body Mass Index (BMI) 40.0 and over, adult: Secondary | ICD-10-CM

## 2015-10-26 DIAGNOSIS — Z1589 Genetic susceptibility to other disease: Secondary | ICD-10-CM

## 2015-10-26 DIAGNOSIS — Z87891 Personal history of nicotine dependence: Secondary | ICD-10-CM | POA: Diagnosis not present

## 2015-10-26 HISTORY — PX: GASTRIC ROUX-EN-Y: SHX5262

## 2015-10-26 LAB — CBC
HEMATOCRIT: 39.3 % (ref 36.0–46.0)
HEMOGLOBIN: 13.4 g/dL (ref 12.0–15.0)
MCH: 27.8 pg (ref 26.0–34.0)
MCHC: 34.1 g/dL (ref 30.0–36.0)
MCV: 81.5 fL (ref 78.0–100.0)
Platelets: 177 10*3/uL (ref 150–400)
RBC: 4.82 MIL/uL (ref 3.87–5.11)
RDW: 15 % (ref 11.5–15.5)
WBC: 11.9 10*3/uL — AB (ref 4.0–10.5)

## 2015-10-26 LAB — PREGNANCY, URINE: Preg Test, Ur: NEGATIVE

## 2015-10-26 LAB — CREATININE, SERUM: Creatinine, Ser: 1.02 mg/dL — ABNORMAL HIGH (ref 0.44–1.00)

## 2015-10-26 SURGERY — LAPAROSCOPIC ROUX-EN-Y GASTRIC BYPASS WITH UPPER ENDOSCOPY
Anesthesia: General

## 2015-10-26 MED ORDER — SCOPOLAMINE 1 MG/3DAYS TD PT72
MEDICATED_PATCH | TRANSDERMAL | Status: AC
  Filled 2015-10-26: qty 1

## 2015-10-26 MED ORDER — HYDROMORPHONE HCL 1 MG/ML IJ SOLN
INTRAMUSCULAR | Status: AC
  Filled 2015-10-26: qty 1

## 2015-10-26 MED ORDER — ROCURONIUM BROMIDE 100 MG/10ML IV SOLN
INTRAVENOUS | Status: DC | PRN
  Administered 2015-10-26: 50 mg via INTRAVENOUS
  Administered 2015-10-26 (×5): 20 mg via INTRAVENOUS

## 2015-10-26 MED ORDER — ONDANSETRON HCL 4 MG/2ML IJ SOLN
INTRAMUSCULAR | Status: AC
Start: 2015-10-26 — End: 2015-10-26
  Filled 2015-10-26: qty 2

## 2015-10-26 MED ORDER — ROCURONIUM BROMIDE 100 MG/10ML IV SOLN
INTRAVENOUS | Status: AC
  Filled 2015-10-26: qty 1

## 2015-10-26 MED ORDER — PROPOFOL 10 MG/ML IV BOLUS
INTRAVENOUS | Status: AC
  Filled 2015-10-26: qty 20

## 2015-10-26 MED ORDER — DIPHENHYDRAMINE HCL 50 MG/ML IJ SOLN
INTRAMUSCULAR | Status: AC
  Filled 2015-10-26: qty 1

## 2015-10-26 MED ORDER — EVICEL 5 ML EX KIT
PACK | CUTANEOUS | Status: DC | PRN
  Administered 2015-10-26: 5 mL

## 2015-10-26 MED ORDER — ONDANSETRON HCL 4 MG/2ML IJ SOLN
INTRAMUSCULAR | Status: AC
  Filled 2015-10-26: qty 2

## 2015-10-26 MED ORDER — ACETAMINOPHEN 160 MG/5ML PO SOLN: 325.0000 mg | ORAL | Status: DC | PRN

## 2015-10-26 MED ORDER — SODIUM CHLORIDE 0.9 % IJ SOLN
INTRAMUSCULAR | Status: AC
  Filled 2015-10-26: qty 10

## 2015-10-26 MED ORDER — ONDANSETRON HCL 4 MG/2ML IJ SOLN
4.0000 mg | INTRAMUSCULAR | Status: DC | PRN
  Administered 2015-10-27 (×2): 4 mg via INTRAVENOUS
  Filled 2015-10-26 (×2): qty 2

## 2015-10-26 MED ORDER — PROMETHAZINE HCL 25 MG/ML IJ SOLN
INTRAMUSCULAR | Status: AC
  Filled 2015-10-26: qty 1

## 2015-10-26 MED ORDER — LACTATED RINGERS IR SOLN
Status: DC | PRN
  Administered 2015-10-26: 1000 mL

## 2015-10-26 MED ORDER — ALBUTEROL SULFATE HFA 108 (90 BASE) MCG/ACT IN AERS: 2.0000 | INHALATION_SPRAY | Freq: Four times a day (QID) | RESPIRATORY_TRACT | Status: DC | PRN

## 2015-10-26 MED ORDER — DEXAMETHASONE SODIUM PHOSPHATE 10 MG/ML IJ SOLN
INTRAMUSCULAR | Status: DC | PRN
  Administered 2015-10-26: 10 mg via INTRAVENOUS

## 2015-10-26 MED ORDER — MORPHINE SULFATE (PF) 2 MG/ML IV SOLN
2.0000 mg | INTRAVENOUS | Status: DC | PRN
  Administered 2015-10-26 – 2015-10-27 (×4): 2 mg via INTRAVENOUS
  Filled 2015-10-26 (×4): qty 1

## 2015-10-26 MED ORDER — MIDAZOLAM HCL 2 MG/2ML IJ SOLN
INTRAMUSCULAR | Status: AC
  Filled 2015-10-26: qty 2

## 2015-10-26 MED ORDER — HEPARIN SODIUM (PORCINE) 5000 UNIT/ML IJ SOLN
5000.0000 [IU] | INTRAMUSCULAR | Status: AC
  Administered 2015-10-26: 5000 [IU] via SUBCUTANEOUS
  Filled 2015-10-26: qty 1

## 2015-10-26 MED ORDER — OXYCODONE HCL 5 MG/5ML PO SOLN
5.0000 mg | ORAL | Status: DC | PRN
  Administered 2015-10-27 (×2): 5 mg via ORAL
  Filled 2015-10-26 (×2): qty 5

## 2015-10-26 MED ORDER — FENTANYL CITRATE (PF) 250 MCG/5ML IJ SOLN
INTRAMUSCULAR | Status: AC
  Filled 2015-10-26: qty 5

## 2015-10-26 MED ORDER — SUGAMMADEX SODIUM 500 MG/5ML IV SOLN
INTRAVENOUS | Status: AC
  Filled 2015-10-26: qty 5

## 2015-10-26 MED ORDER — FENTANYL CITRATE (PF) 100 MCG/2ML IJ SOLN
INTRAMUSCULAR | Status: DC | PRN
  Administered 2015-10-26: 50 ug via INTRAVENOUS
  Administered 2015-10-26: 100 ug via INTRAVENOUS
  Administered 2015-10-26: 50 ug via INTRAVENOUS
  Administered 2015-10-26: 100 ug via INTRAVENOUS
  Administered 2015-10-26: 50 ug via INTRAVENOUS
  Administered 2015-10-26: 100 ug via INTRAVENOUS
  Administered 2015-10-26: 50 ug via INTRAVENOUS

## 2015-10-26 MED ORDER — DEXTROSE 5 % IV SOLN
2.0000 g | INTRAVENOUS | Status: AC
  Administered 2015-10-26 (×2): 2 g via INTRAVENOUS

## 2015-10-26 MED ORDER — BUPIVACAINE LIPOSOME 1.3 % IJ SUSP
20.0000 mL | Freq: Once | INTRAMUSCULAR | Status: DC
  Filled 2015-10-26: qty 20

## 2015-10-26 MED ORDER — ONDANSETRON HCL 4 MG/2ML IJ SOLN
INTRAMUSCULAR | Status: DC | PRN
  Administered 2015-10-26 (×2): 4 mg via INTRAVENOUS

## 2015-10-26 MED ORDER — PROPOFOL 10 MG/ML IV BOLUS
INTRAVENOUS | Status: DC | PRN
  Administered 2015-10-26: 250 mg via INTRAVENOUS

## 2015-10-26 MED ORDER — DEXAMETHASONE SODIUM PHOSPHATE 10 MG/ML IJ SOLN
INTRAMUSCULAR | Status: AC
  Filled 2015-10-26: qty 1

## 2015-10-26 MED ORDER — SUCCINYLCHOLINE CHLORIDE 20 MG/ML IJ SOLN
INTRAMUSCULAR | Status: DC | PRN
  Administered 2015-10-26: 100 mg via INTRAVENOUS

## 2015-10-26 MED ORDER — LIDOCAINE HCL (CARDIAC) 20 MG/ML IV SOLN
INTRAVENOUS | Status: DC | PRN
  Administered 2015-10-26: 100 mg via INTRAVENOUS

## 2015-10-26 MED ORDER — HYDROMORPHONE HCL 1 MG/ML IJ SOLN
0.2500 mg | INTRAMUSCULAR | Status: DC | PRN
  Administered 2015-10-26 (×2): 0.25 mg via INTRAVENOUS
  Administered 2015-10-26: 0.5 mg via INTRAVENOUS
  Administered 2015-10-26 (×2): 0.25 mg via INTRAVENOUS

## 2015-10-26 MED ORDER — LIDOCAINE HCL (CARDIAC) 20 MG/ML IV SOLN
INTRAVENOUS | Status: AC
  Filled 2015-10-26: qty 5

## 2015-10-26 MED ORDER — ALBUTEROL SULFATE (2.5 MG/3ML) 0.083% IN NEBU: 2.5000 mg | INHALATION_SOLUTION | Freq: Four times a day (QID) | RESPIRATORY_TRACT | Status: DC | PRN

## 2015-10-26 MED ORDER — CHLORHEXIDINE GLUCONATE CLOTH 2 % EX PADS: 6.0000 | MEDICATED_PAD | Freq: Once | CUTANEOUS | Status: DC

## 2015-10-26 MED ORDER — DEXTROSE 5 % IV SOLN
INTRAVENOUS | Status: AC
  Filled 2015-10-26 (×2): qty 2

## 2015-10-26 MED ORDER — MIDAZOLAM HCL 5 MG/5ML IJ SOLN
INTRAMUSCULAR | Status: DC | PRN
  Administered 2015-10-26: 2 mg via INTRAVENOUS

## 2015-10-26 MED ORDER — FENTANYL CITRATE (PF) 100 MCG/2ML IJ SOLN
INTRAMUSCULAR | Status: AC
  Filled 2015-10-26: qty 2

## 2015-10-26 MED ORDER — ACETAMINOPHEN 160 MG/5ML PO SOLN
650.0000 mg | ORAL | Status: DC | PRN
  Administered 2015-10-28: 650 mg via ORAL
  Filled 2015-10-26: qty 20.3

## 2015-10-26 MED ORDER — SCOPOLAMINE 1 MG/3DAYS TD PT72
MEDICATED_PATCH | TRANSDERMAL | Status: DC | PRN
  Administered 2015-10-26: 1 via TRANSDERMAL

## 2015-10-26 MED ORDER — SUGAMMADEX SODIUM 200 MG/2ML IV SOLN
INTRAVENOUS | Status: DC | PRN
  Administered 2015-10-26: 300 mg via INTRAVENOUS

## 2015-10-26 MED ORDER — EPHEDRINE SULFATE 50 MG/ML IJ SOLN
INTRAMUSCULAR | Status: AC
  Filled 2015-10-26: qty 1

## 2015-10-26 MED ORDER — PREMIER PROTEIN SHAKE
2.0000 [oz_av] | Freq: Four times a day (QID) | ORAL | Status: DC
  Administered 2015-10-28 (×3): 2 [oz_av] via ORAL

## 2015-10-26 MED ORDER — PANTOPRAZOLE SODIUM 40 MG IV SOLR
40.0000 mg | Freq: Every day | INTRAVENOUS | Status: DC
  Administered 2015-10-26 – 2015-10-27 (×2): 40 mg via INTRAVENOUS
  Filled 2015-10-26 (×3): qty 40

## 2015-10-26 MED ORDER — ARTIFICIAL TEARS OP OINT
TOPICAL_OINTMENT | OPHTHALMIC | Status: AC
  Filled 2015-10-26: qty 3.5

## 2015-10-26 MED ORDER — PROMETHAZINE HCL 25 MG/ML IJ SOLN
6.2500 mg | INTRAMUSCULAR | Status: DC | PRN
Start: 2015-10-26 — End: 2015-10-26
  Administered 2015-10-26: 12.5 mg via INTRAVENOUS

## 2015-10-26 MED ORDER — HEPARIN SODIUM (PORCINE) 5000 UNIT/ML IJ SOLN
5000.0000 [IU] | Freq: Three times a day (TID) | INTRAMUSCULAR | Status: DC
  Administered 2015-10-26 – 2015-10-28 (×5): 5000 [IU] via SUBCUTANEOUS
  Filled 2015-10-26 (×9): qty 1

## 2015-10-26 MED ORDER — FENTANYL CITRATE (PF) 100 MCG/2ML IJ SOLN
25.0000 ug | INTRAMUSCULAR | Status: DC | PRN
  Administered 2015-10-26: 25 ug via INTRAVENOUS
  Administered 2015-10-26: 50 ug via INTRAVENOUS
  Administered 2015-10-26: 25 ug via INTRAVENOUS
  Administered 2015-10-26: 50 ug via INTRAVENOUS

## 2015-10-26 MED ORDER — LACTATED RINGERS IV SOLN
INTRAVENOUS | Status: DC | PRN
  Administered 2015-10-26 (×2): via INTRAVENOUS

## 2015-10-26 MED ORDER — LACTATED RINGERS IV SOLN
INTRAVENOUS | Status: DC
  Administered 2015-10-26: 12:00:00 via INTRAVENOUS

## 2015-10-26 MED ORDER — ONDANSETRON HCL 4 MG/2ML IJ SOLN: 4.0000 mg | Freq: Once | INTRAMUSCULAR | Status: DC | PRN

## 2015-10-26 MED ORDER — BUPIVACAINE LIPOSOME 1.3 % IJ SUSP
INTRAMUSCULAR | Status: DC | PRN
  Administered 2015-10-26: 30 mL

## 2015-10-26 MED ORDER — TISSEEL VH 10 ML EX KIT
PACK | CUTANEOUS | Status: AC
  Filled 2015-10-26: qty 3

## 2015-10-26 MED ORDER — DIPHENHYDRAMINE HCL 50 MG/ML IJ SOLN
INTRAMUSCULAR | Status: DC | PRN
  Administered 2015-10-26: 25 mg via INTRAVENOUS

## 2015-10-26 SURGICAL SUPPLY — 72 items
APPLICATOR COTTON TIP 6IN STRL (MISCELLANEOUS) ×6 IMPLANT
APPLIER CLIP ROT 10 11.4 M/L (STAPLE)
APPLIER CLIP ROT 13.4 12 LRG (CLIP)
BENZOIN TINCTURE PRP APPL 2/3 (GAUZE/BANDAGES/DRESSINGS) IMPLANT
BLADE SURG 15 STRL LF DISP TIS (BLADE) ×1 IMPLANT
BLADE SURG 15 STRL SS (BLADE) ×2
CABLE HIGH FREQUENCY MONO STRZ (ELECTRODE) IMPLANT
CLIP APPLIE ROT 10 11.4 M/L (STAPLE) IMPLANT
CLIP APPLIE ROT 13.4 12 LRG (CLIP) IMPLANT
CLIP SUT LAPRA TY ABSORB (SUTURE) ×3 IMPLANT
CLOSURE WOUND 1/2 X4 (GAUZE/BANDAGES/DRESSINGS)
COVER SURGICAL LIGHT HANDLE (MISCELLANEOUS) ×3 IMPLANT
DEVICE SUT QUICK LOAD TK 5 (STAPLE) IMPLANT
DEVICE SUT TI-KNOT TK 5X26 (MISCELLANEOUS) IMPLANT
DEVICE SUTURE ENDOST 10MM (ENDOMECHANICALS) ×3 IMPLANT
DEVICE TI KNOT TK5 (MISCELLANEOUS)
DISSECTOR BLUNT TIP ENDO 5MM (MISCELLANEOUS) IMPLANT
DRAIN PENROSE 18X1/4 LTX STRL (WOUND CARE) ×3 IMPLANT
GAUZE SPONGE 4X4 12PLY STRL (GAUZE/BANDAGES/DRESSINGS) IMPLANT
GAUZE SPONGE 4X4 16PLY XRAY LF (GAUZE/BANDAGES/DRESSINGS) ×3 IMPLANT
GLOVE BIOGEL M 8.0 STRL (GLOVE) ×3 IMPLANT
GOWN STRL REUS W/TWL XL LVL3 (GOWN DISPOSABLE) ×12 IMPLANT
HANDLE STAPLE EGIA 4 XL (STAPLE) ×3 IMPLANT
HOVERMATT SINGLE USE (MISCELLANEOUS) ×3 IMPLANT
KIT BASIN OR (CUSTOM PROCEDURE TRAY) ×3 IMPLANT
KIT GASTRIC LAVAGE 34FR ADT (SET/KITS/TRAYS/PACK) ×3 IMPLANT
MARKER SKIN DUAL TIP RULER LAB (MISCELLANEOUS) ×3 IMPLANT
NEEDLE SPNL 22GX3.5 QUINCKE BK (NEEDLE) ×3 IMPLANT
PACK CARDIOVASCULAR III (CUSTOM PROCEDURE TRAY) ×3 IMPLANT
PORT ACCESS TROCAR AIRSEAL 12 (TROCAR) ×1 IMPLANT
PORT ACCESS TROCAR AIRSEAL 5M (TROCAR) ×2
QUICK LOAD TK 5 (STAPLE)
RELOAD EGIA 45 MED/THCK PURPLE (STAPLE) IMPLANT
RELOAD EGIA 45 TAN VASC (STAPLE) IMPLANT
RELOAD EGIA 60 MED/THCK PURPLE (STAPLE) ×9 IMPLANT
RELOAD EGIA 60 TAN VASC (STAPLE) ×3 IMPLANT
RELOAD ENDO STITCH 2.0 (ENDOMECHANICALS) ×18
RELOAD TRI 45 ART MED THCK PUR (STAPLE) ×3 IMPLANT
RELOAD TRI 60 ART MED THCK PUR (STAPLE) IMPLANT
SCISSORS LAP 5X45 EPIX DISP (ENDOMECHANICALS) ×3 IMPLANT
SCRUB PCMX 4 OZ (MISCELLANEOUS) ×3 IMPLANT
SEALANT SURGICAL APPL DUAL CAN (MISCELLANEOUS) ×3 IMPLANT
SET IRRIG TUBING LAPAROSCOPIC (IRRIGATION / IRRIGATOR) ×3 IMPLANT
SET TRI-LUMEN FLTR TB AIRSEAL (TUBING) ×3 IMPLANT
SHEARS HARMONIC ACE PLUS 45CM (MISCELLANEOUS) ×3 IMPLANT
SLEEVE ADV FIXATION 12X100MM (TROCAR) ×6 IMPLANT
SLEEVE ADV FIXATION 5X100MM (TROCAR) IMPLANT
SOLUTION ANTI FOG 6CC (MISCELLANEOUS) ×3 IMPLANT
STAPLER VISISTAT 35W (STAPLE) ×3 IMPLANT
STRIP CLOSURE SKIN 1/2X4 (GAUZE/BANDAGES/DRESSINGS) IMPLANT
SUT RELOAD ENDO STITCH 2 48X1 (ENDOMECHANICALS) ×5
SUT RELOAD ENDO STITCH 2.0 (ENDOMECHANICALS) ×4
SUT SURGIDAC NAB ES-9 0 48 120 (SUTURE) IMPLANT
SUT VIC AB 2-0 SH 27 (SUTURE) ×2
SUT VIC AB 2-0 SH 27X BRD (SUTURE) ×1 IMPLANT
SUT VIC AB 4-0 SH 18 (SUTURE) ×3 IMPLANT
SUTURE RELOAD END STTCH 2 48X1 (ENDOMECHANICALS) ×5 IMPLANT
SUTURE RELOAD ENDO STITCH 2.0 (ENDOMECHANICALS) ×4 IMPLANT
SYR 10ML ECCENTRIC (SYRINGE) ×3 IMPLANT
SYR 20CC LL (SYRINGE) ×6 IMPLANT
SYR 50ML LL SCALE MARK (SYRINGE) ×3 IMPLANT
TOWEL OR 17X26 10 PK STRL BLUE (TOWEL DISPOSABLE) ×6 IMPLANT
TOWEL OR NON WOVEN STRL DISP B (DISPOSABLE) ×3 IMPLANT
TRAY FOLEY W/METER SILVER 14FR (SET/KITS/TRAYS/PACK) ×3 IMPLANT
TRAY FOLEY W/METER SILVER 16FR (SET/KITS/TRAYS/PACK) IMPLANT
TROCAR ADV FIXATION 12X100MM (TROCAR) ×3 IMPLANT
TROCAR ADV FIXATION 5X100MM (TROCAR) ×3 IMPLANT
TROCAR BLADELESS OPT 5 100 (ENDOMECHANICALS) ×3 IMPLANT
TROCAR XCEL 12X100 BLDLESS (ENDOMECHANICALS) ×3 IMPLANT
TUBING CONNECTING 10 (TUBING) ×2 IMPLANT
TUBING CONNECTING 10' (TUBING) ×1
TUBING ENDO SMARTCAP PENTAX (MISCELLANEOUS) ×3 IMPLANT

## 2015-10-26 NOTE — Interval H&P Note (Signed)
History and Physical Interval Note:  10/26/2015 7:06 AM  Kaylee Roberts  has presented today for surgery, with the diagnosis of MORBID OBESITY  The various methods of treatment have been discussed with the patient and family. After consideration of risks, benefits and other options for treatment, the patient has consented to  Procedure(s): LAPAROSCOPIC ROUX-EN-Y GASTRIC BYPASS WITH UPPER ENDOSCOPY (N/A) as a surgical intervention .  The patient's history has been reviewed, patient examined, no change in status, stable for surgery.  I have reviewed the patient's chart and labs.  Questions were answered to the patient's satisfaction.     Seva Chancy B

## 2015-10-26 NOTE — H&P (View-Only) (Signed)
Chief Complaint:  For roux en Y gastric bypass  History of Present Illness:  Kaylee Roberts is an 35 y.o. female who has three family members that [place]ariatric [place] in [place].  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  She decided to have a roux en y gastric bypass.  UGI and ultrasound were negative.    Past Medical History  Diagnosis Date  . GERD (gastroesophageal reflux disease)   . Asthma     exercise induced asthma  . History of chicken pox   . Osteoarthritis   . Complication of anesthesia     "i cry a lot"  . Family history of adverse reaction to anesthesia     mom gets combative  . Headache     hx migraines  . H/O MTHFR mutation   . PCOS (polycystic ovarian syndrome)   . Sleep apnea     unable to tolerate mask    Past Surgical History  Procedure Laterality Date  . Cesarean section  2010 & 2013  . Fracture surgery  1990    nasal  . Refractive surgery      Current Outpatient Prescriptions  Medication Sig Dispense Refill  . albuterol (PROVENTIL HFA;VENTOLIN HFA) 108 (90 BASE) MCG/ACT inhaler Inhale 2 puffs into the lungs every 6 (six) hours as needed for wheezing or shortness of breath. 1 Inhaler 2  . BIOTIN PO Take 1 tablet by mouth daily.     Marland Kitchen ibuprofen (ADVIL,MOTRIN) 200 MG tablet Take 600 mg by mouth every 6 (six) hours as needed.    . metFORMIN (GLUCOPHAGE) 500 MG tablet Take 1 tablet (500 mg total) by mouth daily with breakfast. (Patient not taking: Reported on 10/08/2015) 90 tablet 1  . Multiple Vitamin (MULTIVITAMIN WITH MINERALS) TABS tablet Take 1 tablet by mouth daily.    . progesterone (PROMETRIUM) 200 MG capsule Take 200 mg by mouth at bedtime. Day 14-28 of cycle. Then stop until day 14 again.  11   No current facility-administered medications for this visit.   Allegra; Claritin; and Tetracyclines & related Family History  Problem Relation Age of Onset  . Arthritis  Mother   . Diabetes Mother   . Hypertension Father   . Bleeding Disorder Sister   . Depression Sister   . Arthritis Maternal Grandmother   . Stroke Maternal Grandmother   . Alcohol abuse Maternal Grandfather   . Diabetes Maternal Grandfather   . Hyperlipidemia Paternal Grandmother   . Heart disease Paternal Grandmother   . Hypertension Paternal Grandmother   . Hyperlipidemia Paternal Grandfather   . Heart disease Paternal Grandfather   . Hypertension Paternal Grandfather    Social History:   reports that she quit smoking about 10 years ago. She does not have any smokeless tobacco history on file. She reports that she does not drink alcohol or use illicit drugs.   REVIEW OF SYSTEMS : Negative except for see problem list  Physical Exam:   Last menstrual period 08/30/2015. There is no weight on file to calculate BMI.  Gen:  WDWN WF NAD  Neurological: Alert and oriented to person, place, and time. Motor and sensory function is grossly intact  Head: Normocephalic and atraumatic.  Eyes: Conjunctivae are normal. Pupils are equal, round, and reactive to light. No scleral icterus.  Neck: Normal range of motion. Neck supple. No tracheal deviation or thyromegaly present.  Cardiovascular:  SR without murmurs or gallops.  No carotid bruits Breast:  Not examined Respiratory: Effort normal.  No respiratory distress. No chest wall tenderness. Breath sounds normal.  No wheezes, rales or rhonchi.  Abdomen:  nontender GU:  Not examined Musculoskeletal: Normal range of motion. Extremities are nontender. No cyanosis, edema or clubbing noted Lymphadenopathy: No cervical, preauricular, postauricular or axillary adenopathy is present Skin: Skin is warm and dry. No rash noted. No diaphoresis. No erythema. No pallor. Pscyh: Normal mood and affect. Behavior is normal. Judgment and thought content normal.   LABORATORY RESULTS: No results found for this or any previous visit (from the past 48  hour(s)).   RADIOLOGY RESULTS: No results found.  Problem List: Patient Active Problem List   Diagnosis Date Noted  . Anxiety and depression 04/02/2015  . OSA (obstructive sleep apnea) 04/02/2015  . Morbid obesity (HCC) 04/02/2015  . PCOS (polycystic ovarian syndrome) 04/02/2015  . Exercise-induced asthma 04/02/2015  . Migraine 04/02/2015    Assessment & Plan: Morbid obesity (BMI 46)  for roux en y gastric bypass    Matt B. Daphine DeutscherMartin, MD, [facility]ACS  [facility] [phone] beeper [phone]  10/20/2015 3:50 PM

## 2015-10-26 NOTE — Anesthesia Procedure Notes (Signed)
Procedure Name: Intubation Date/Time: 10/26/2015 7:20 AM Performed by: Orest DikesPETERS, Katrinna Travieso J Pre-anesthesia Checklist: Patient identified, Emergency Drugs available, Suction available and Patient being monitored Patient Re-evaluated:Patient Re-evaluated prior to inductionOxygen Delivery Method: Circle System Utilized Preoxygenation: Pre-oxygenation with 100% oxygen Intubation Type: IV induction Ventilation: Mask ventilation without difficulty Laryngoscope Size: Mac and 4 Grade View: Grade I Tube type: Oral Tube size: 7.0 mm Number of attempts: 1 Airway Equipment and Method: Stylet and Oral airway Placement Confirmation: ETT inserted through vocal cords under direct vision,  positive ETCO2 and breath sounds checked- equal and bilateral Secured at: 23 cm Tube secured with: Tape Dental Injury: Teeth and Oropharynx as per pre-operative assessment

## 2015-10-26 NOTE — Op Note (Signed)
Surgeon: Pollyann SavoyMatt B. Daphine DeutscherMartin, MD, FACS Asst:  Gaynelle AduEric Wilson, MD, FACS Anesthesia: General endotracheal Drains: None  Procedure: Laparoscopic Roux en Y gastric bypass with 40 cm BP limb and 100 cm Roux limb, antecolic, antegastric, candy cane to the left.  Closure of Peterson's defect. Upper endoscopy.   Description of Procedure:  [facility] and given general anesthesia.  The abdomen was prepped with PCMX and draped sterilely.  A time out was performed.    The operation began by identifying the ligament of Treitz. I measured 40 cm downstream and divided the bowel with a 6 cm Covidian stapler.  I sutured a Penrose drain along the Roux limb end.  I measured a 1 meter (100 cm) Roux limb and then placed the distal bowels to the BP limb side by side and performed a stapled jejunojejunostomy. The common defect was closed from either end with 4-0 Vicryl using the Endo Stitch. The mesenteric defect was closed with a running 2-0 silk using the Endo Stitch. Tisseel was applied to the suture line.  The omentum was divided with the harmonic scalpel.  The Nathanson retractor was inserted in the left lateral segment of liver was retracted. The foregut dissection ensued.  5 cm down along the lessor curvature we dissected into the mesentery in to the free space behind the stomach.  A 6 cm purple load was used in the first firing.  A second firing was performed to fashion a small pouch.  The purple with TRS was then used twice (6 cm) and then a final 4.5 cm with TRS.    The Roux limb was then brought up with the candycane pointed left and a back row of sutures of 2-0 Vicryl were placed. I opened along the right side of each structure and inserted the 4.5 cm stapler to create the gastrojejunostomy. The common defect was closed from either end with 2-0 Vicryl and a second row was placed anterior to that the Ewald tube acting as a stent across the anastomosis. The Penrose drain was removed. Peterson's  defect was closed with 2-0 silk.   Endoscopy was performed by Dr. Andrey CampanileWilson and there was a leaking fluid from the endoscope.  The air was good and endoscopy showed a small pouch and good anastomosis.  A few bubbles were seen on the left side of the anastomosis.  A figure of 8 was placed.  No air leaks were seen.    The incisions were injected with Exparel and were closed with 4-0 Vicryl and Liquiban.    The patient was taken to the recovery room in satisfactory condition.  Matt B. Daphine DeutscherMartin, MD, FACS

## 2015-10-26 NOTE — Anesthesia Postprocedure Evaluation (Signed)
Anesthesia Post Note  Patient: Kaylee Roberts  Procedure(s) Performed: Procedure(s) (LRB): LAPAROSCOPIC ROUX-EN-Y GASTRIC BYPASS WITH UPPER ENDOSCOPY (N/A)  Patient location during evaluation: PACU Anesthesia Type: General Level of consciousness: awake and alert Pain management: pain level controlled Vital Signs Assessment: post-procedure vital signs reviewed and stable Respiratory status: spontaneous breathing, nonlabored ventilation, respiratory function stable and patient connected to nasal cannula oxygen Cardiovascular status: blood pressure returned to baseline and stable Postop Assessment: no signs of nausea or vomiting Anesthetic complications: no    Last Vitals:  Filed Vitals:   10/26/15 1430 10/26/15 1445  BP: 115/71 117/74  Pulse: 93 93  Temp:    Resp: 28 24    Last Pain:  Filed Vitals:   10/26/15 1449  PainSc: 7                  Isaac Dubie JENNETTE

## 2015-10-26 NOTE — Op Note (Signed)
Kaylee Roberts [id number] Oct 11, 1980 10/26/2015  Preoperative diagnosis: morbid obesity  Postoperative diagnosis: Same   Procedure: Upper endoscopy   Surgeon: Atilano InaEric M Anya Murphey M.D., FACS   Anesthesia: Gen.   Indications for procedure: 35 y.o. yo female undergoing a laparoscopic roux en y gastric bypass and an upper endoscopy was requested to evaluate the anastomosis.  Description of procedure: After we have completed the new gastrojejunostomy, I scrubbed out and obtained the Olympus endoscope. I gently placed endoscope in the patient's oropharynx and gently glided it down the esophagus without any difficulty under direct visualization. Once I was in the gastric pouch, I insufflated the pouch was air. The pouch was approximately 4.5 cm in size. I was able to cannulate and advanced the scope through the gastrojejunostomy. Dr.Martin had placed saline in the upper abdomen. Upon further insufflation of the gastric pouch there were 1 or 2 bubbles initially laterally on spleen side. Upon further distension of pouch, there were no more bubbles.  Upon further inspection of the gastric pouch, the mucosa appeared normal. There is no evidence of any mucosal abnormality. The gastric pouch and Roux limb were decompressed. The width of the gastrojejunal anastomosis was at least 2.5 cm. The scope was withdrawn. The patient tolerated this portion of the procedure well. Please see Dr Ermalene SearingMartin's operative note for details regarding the laparoscopic roux-en-y gastric bypass.  Kaylee SellaEric M. Andrey CampanileWilson, MD, FACS General, Bariatric, & Minimally Invasive Surgery [facility]

## 2015-10-26 NOTE — Transfer of Care (Signed)
Immediate Anesthesia Transfer of Care Note  Patient: Kaylee Roberts  Procedure(s) Performed: Procedure(s): LAPAROSCOPIC ROUX-EN-Y GASTRIC BYPASS WITH UPPER ENDOSCOPY (N/A)  Patient Location: PACU  Anesthesia Type:General  Level of Consciousness:  sedated, patient cooperative and responds to stimulation  Airway & Oxygen Therapy:Patient Spontanous Breathing and Patient connected to face mask oxgen  Post-op Assessment:  Report given to PACU RN and Post -op Vital signs reviewed and stable  Post vital signs:  Reviewed and stable  Last Vitals:  Filed Vitals:   10/26/15 0534  BP: 129/80  Pulse: 107  Temp: 36.9 C  Resp: 18    Complications: No apparent anesthesia complications

## 2015-10-27 ENCOUNTER — Inpatient Hospital Stay (HOSPITAL_COMMUNITY): Payer: 59

## 2015-10-27 LAB — CBC WITH DIFFERENTIAL/PLATELET
BASOS ABS: 0 10*3/uL (ref 0.0–0.1)
Basophils Relative: 0 %
Eosinophils Absolute: 0 10*3/uL (ref 0.0–0.7)
Eosinophils Relative: 0 %
HCT: 35.4 % — ABNORMAL LOW (ref 36.0–46.0)
Hemoglobin: 11.5 g/dL — ABNORMAL LOW (ref 12.0–15.0)
LYMPHS PCT: 10 %
Lymphs Abs: 1.2 10*3/uL (ref 0.7–4.0)
MCH: 27.2 pg (ref 26.0–34.0)
MCHC: 32.5 g/dL (ref 30.0–36.0)
MCV: 83.7 fL (ref 78.0–100.0)
Monocytes Absolute: 1 10*3/uL (ref 0.1–1.0)
Monocytes Relative: 8 %
Neutro Abs: 10.4 10*3/uL — ABNORMAL HIGH (ref 1.7–7.7)
Neutrophils Relative %: 82 %
PLATELETS: 246 10*3/uL (ref 150–400)
RBC: 4.23 MIL/uL (ref 3.87–5.11)
RDW: 15.2 % (ref 11.5–15.5)
WBC: 12.5 10*3/uL — AB (ref 4.0–10.5)

## 2015-10-27 LAB — HEMOGLOBIN AND HEMATOCRIT, BLOOD
HCT: 35.1 % — ABNORMAL LOW (ref 36.0–46.0)
HEMOGLOBIN: 11.4 g/dL — AB (ref 12.0–15.0)

## 2015-10-27 MED ORDER — DIPHENHYDRAMINE HCL 12.5 MG/5ML PO ELIX
25.0000 mg | ORAL_SOLUTION | Freq: Four times a day (QID) | ORAL | Status: DC | PRN
  Administered 2015-10-28: 25 mg via ORAL
  Filled 2015-10-27: qty 10

## 2015-10-27 MED ORDER — LACTATED RINGERS IV SOLN
INTRAVENOUS | Status: DC
  Administered 2015-10-27: 15:00:00 via INTRAVENOUS

## 2015-10-27 NOTE — Progress Notes (Signed)
Patient c/o feeling flushed. Temp 100.5. Patient encouraged to use incentive spirometer & ambulate. Will recheck &continue to monitor.

## 2015-10-27 NOTE — Progress Notes (Signed)
Patient ID: Kaylee Roberts, female   DOB: Jun 19, 1981, 35 y.o.   MRN: [id number] [facility] Progress Note:   1 Day Post-Op  Subjective: Mental status is clear.Has had swallow.  Walking a lot Objective: Vital signs in last 24 hours: Temp:  [98.4 F (36.9 C)-100.5 F (38.1 C)] 98.8 F (37.1 C) (04/19 0554) Pulse Rate:  [88-109] 88 (04/19 0554) Resp:  [15-28] 16 (04/19 0554) BP: (115-160)/(69-96) 137/79 mmHg (04/19 0554) SpO2:  [94 %-100 %] 100 % (04/19 0554) Weight:  [135.081 kg (297 lb 12.8 oz)] 135.081 kg (297 lb 12.8 oz) (04/18 2349)  Intake/Output from previous day: 04/18 0701 - 04/19 0700 In: 2400 [I.V.:2300; IV Piggyback:100] Out: 1875 [Urine:1850; Blood:25] Intake/Output this shift: Total I/O In: -  Out: 300 [Urine:300]  Physical Exam: Work of breathing is normal.  Incisions ok  Lab Results:  Results for orders placed or performed during the hospital encounter of 10/26/15 (from the past 48 hour(s))  Pregnancy, urine STAT morning of surgery     Status: None   Collection Time: 10/26/15  5:39 AM  Result Value Ref Range   Preg Test, Ur NEGATIVE NEGATIVE    Comment:        THE SENSITIVITY OF THIS METHODOLOGY IS >20 mIU/mL.   CBC     Status: Abnormal   Collection Time: 10/26/15 11:05 AM  Result Value Ref Range   WBC 11.9 (H) 4.0 - 10.5 K/uL   RBC 4.82 3.87 - 5.11 MIL/uL   Hemoglobin 13.4 12.0 - 15.0 g/dL   HCT 39.3 36.0 - 46.0 %   MCV 81.5 78.0 - 100.0 fL   MCH 27.8 26.0 - 34.0 pg   MCHC 34.1 30.0 - 36.0 g/dL   RDW 15.0 11.5 - 15.5 %   Platelets 177 150 - 400 K/uL    Comment: PLATELET COUNT CONFIRMED BY SMEAR  Creatinine, serum     Status: Abnormal   Collection Time: 10/26/15 11:05 AM  Result Value Ref Range   Creatinine, Ser 1.02 (H) 0.44 - 1.00 mg/dL   GFR calc non Af Amer >60 >60 mL/min   GFR calc Af Amer >60 >60 mL/min    Comment: (NOTE) The eGFR has been calculated using the CKD EPI equation. This calculation has not been validated in all  clinical situations. eGFR's persistently <60 mL/min signify possible Chronic Kidney Disease.   CBC WITH DIFFERENTIAL     Status: Abnormal   Collection Time: 10/27/15  4:31 AM  Result Value Ref Range   WBC 12.5 (H) 4.0 - 10.5 K/uL   RBC 4.23 3.87 - 5.11 MIL/uL   Hemoglobin 11.5 (L) 12.0 - 15.0 g/dL   HCT 35.4 (L) 36.0 - 46.0 %   MCV 83.7 78.0 - 100.0 fL   MCH 27.2 26.0 - 34.0 pg   MCHC 32.5 30.0 - 36.0 g/dL   RDW 15.2 11.5 - 15.5 %   Platelets 246 150 - 400 K/uL   Neutrophils Relative % 82 %   Neutro Abs 10.4 (H) 1.7 - 7.7 K/uL   Lymphocytes Relative 10 %   Lymphs Abs 1.2 0.7 - 4.0 K/uL   Monocytes Relative 8 %   Monocytes Absolute 1.0 0.1 - 1.0 K/uL   Eosinophils Relative 0 %   Eosinophils Absolute 0.0 0.0 - 0.7 K/uL   Basophils Relative 0 %   Basophils Absolute 0.0 0.0 - 0.1 K/uL    Radiology/Results: No results found.  Anti-infectives: Anti-infectives    Start     Dose/Rate  Route Frequency Ordered Stop   10/26/15 0602  cefOXitin (MEFOXIN) 2 g in dextrose 5 % 50 mL IVPB     2 g 100 mL/hr over 30 Minutes Intravenous On call to O.R. 10/26/15 0602 10/26/15 1005      Assessment/Plan: Problem List: Patient Active Problem List   Diagnosis Date Noted  . 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 S/P gastric bypass 10/26/2015  . Anxiety and depression 04/02/2015  . OSA (obstructive sleep apnea) 04/02/2015  . Morbid obesity ([place]) 04/02/2015  . PCOS (polycystic ovarian syndrome) 04/02/2015  . Exercise-induced asthma 04/02/2015  . Migraine 04/02/2015    Advance to PD 1 diet.   1 Day Post-Op    LOS: 1 day   Matt B. Hassell Done, MD, [facility] [phone] beeper [phone]  10/27/2015 8:47 AM

## 2015-10-27 NOTE — Progress Notes (Signed)
Patient alert and oriented, Post op day 1.  Provided support and encouragement.  Encouraged pulmonary toilet, ambulation and small sips of liquids.  All questions answered.  Will continue to monitor. 

## 2015-10-27 NOTE — Plan of Care (Signed)
Problem: Food- and Nutrition-Related Knowledge Deficit (NB-1.1) Goal: Nutrition education Formal process to instruct or train a patient/client in a skill or to impart knowledge to help patients/clients voluntarily manage or modify food choices and eating behavior to maintain or improve health. Outcome: Completed/Met Date Met:  10/27/15 Nutrition Education Note  Received consult for diet education per DROP protocol.   Discussed 2 week post op diet with pt. Emphasized that liquids must be non carbonated, non caffeinated, and sugar free. Fluid goals discussed. Reviewed progression of diet to include soft proteins at 7-10 days post-op. Pt to follow up with outpatient bariatric RD for further diet progression after 2 weeks. Multivitamins and minerals also reviewed. Teach back method used, pt expressed understanding, expect good compliance.   Diet: First 2 Weeks  You will see the dietitian about two (2) weeks after your surgery. The dietitian will increase the types of foods you can eat if you are handling liquids well:  If you have severe vomiting or nausea and cannot handle clear liquids lasting longer than 1 day, call your surgeon  Protein Shake  Drink at least 2 ounces of shake 5-6 times per day  Each serving of protein shakes (usually 8 - 12 ounces) should have a minimum of:  15 grams of protein  And no more than 5 grams of carbohydrate  Goal for protein each day:  Men = 80 grams per day  Women = 60 grams per day  Protein powder may be added to fluids such as non-fat milk or Lactaid milk or Soy milk (limit to 35 grams added protein powder per serving)   Hydration  Slowly increase the amount of water and other clear liquids as tolerated (See Acceptable Fluids)  Slowly increase the amount of protein shake as tolerated  Sip fluids slowly and throughout the day  May use sugar substitutes in small amounts (no more than 6 - 8 packets per day; i.e. Splenda)   Fluid Goal  The first goal is to  drink at least 8 ounces of protein shake/drink per day (or as directed by the nutritionist); some examples of protein shakes are Syntrax Nectar, Adkins Advantage, EAS Edge HP, and Unjury. See handout from pre-op Bariatric Education Class:  Slowly increase the amount of protein shake you drink as tolerated  You may find it easier to slowly sip shakes throughout the day  It is important to get your proteins in first  Your fluid goal is to drink 64 - 100 ounces of fluid daily  It may take a few weeks to build up to this  32 oz (or more) should be clear liquids  And  32 oz (or more) should be full liquids (see below for examples)  Liquids should not contain sugar, caffeine, or carbonation   Clear Liquids:  Water or Sugar-free flavored water (i.e. Fruit H2O, Propel)  Decaffeinated coffee or tea (sugar-free)  Crystal Lite, Wyler's Lite, Minute Maid Lite  Sugar-free Jell-O  Bouillon or broth  Sugar-free Popsicle: *Less than 20 calories each; Limit 1 per day   Full Liquids:  Protein Shakes/Drinks + 2 choices per day of other full liquids  Full liquids must be:  No More Than 12 grams of Carbs per serving  No More Than 3 grams of Fat per serving  Strained low-fat cream soup  Non-Fat milk  Fat-free Lactaid Milk  Sugar-free yogurt (Dannon Lite & Fit, Greek yogurt)     Kaylee Hovanec, MS, RD, LDN Pager: [phone] After Hours Pager: [phone]        

## 2015-10-28 LAB — CBC WITH DIFFERENTIAL/PLATELET
BASOS ABS: 0 10*3/uL (ref 0.0–0.1)
BASOS PCT: 0 %
Eosinophils Absolute: 0 10*3/uL (ref 0.0–0.7)
Eosinophils Relative: 0 %
HEMATOCRIT: 32.8 % — AB (ref 36.0–46.0)
HEMOGLOBIN: 10.7 g/dL — AB (ref 12.0–15.0)
Lymphocytes Relative: 22 %
Lymphs Abs: 2.3 10*3/uL (ref 0.7–4.0)
MCH: 27.1 pg (ref 26.0–34.0)
MCHC: 32.6 g/dL (ref 30.0–36.0)
MCV: 83 fL (ref 78.0–100.0)
Monocytes Absolute: 0.7 10*3/uL (ref 0.1–1.0)
Monocytes Relative: 7 %
NEUTROS ABS: 7.2 10*3/uL (ref 1.7–7.7)
NEUTROS PCT: 71 %
Platelets: 206 10*3/uL (ref 150–400)
RBC: 3.95 MIL/uL (ref 3.87–5.11)
RDW: 15.2 % (ref 11.5–15.5)
WBC: 10.3 10*3/uL (ref 4.0–10.5)

## 2015-10-28 NOTE — Discharge Summary (Signed)
Physician Discharge Summary  Patient ID: Kaylee Roberts MRN: [id number] DOB/AGE: 1981-03-12 35 y.o.  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  PD 1 swallow ok.  Diet advanced and ready for discharge on PD 2 Consults: none  Significant Diagnostic Studies: ugi looked good    Discharge Exam: Blood pressure 129/67, pulse 70, temperature 98.8 F (37.1 C), temperature source Oral, resp. rate 16, height 5\' 7"  (1.702 m), weight 135.081 kg (297 lb 12.8 oz), SpO2 97 %. Incisions OK.    Disposition: Final discharge disposition not confirmed  Discharge Instructions    Ambulate hourly while awake    Complete by:  As directed      Call MD for:  difficulty breathing, headache or visual disturbances    Complete by:  As directed      Call MD for:  persistant dizziness or light-headedness    Complete by:  As directed      Call MD for:  persistant nausea and vomiting    Complete by:  As directed      Call MD for:  redness, tenderness, or signs of infection (pain, swelling, redness, odor or green/yellow discharge around incision site)    Complete by:  As directed      Call MD for:  severe uncontrolled pain    Complete by:  As directed      Call MD for:  temperature >101 F    Complete by:  As directed      Diet bariatric full liquid    Complete by:  As directed      Incentive spirometry    Complete by:  As directed   Perform hourly while awake            Medication List    TAKE these medications        albuterol 108 (90 Base) MCG/ACT inhaler  Commonly known as:  PROVENTIL HFA;VENTOLIN HFA  Inhale 2 puffs into the lungs every 6 (six) hours as needed for wheezing or shortness of breath.     BIOTIN PO  Take 1 tablet by mouth daily.     ibuprofen 200 MG tablet  Commonly known as:  ADVIL,MOTRIN  Take 600 mg by mouth every 6 (six) hours as needed.  Notes to Patient:  Avoid NSAIDs for 6-8 weeks after surgery      metFORMIN 500 MG tablet  Commonly known as:  GLUCOPHAGE  Take 1 tablet (500 mg total) by mouth daily with breakfast.  Notes to Patient:  Monitor Blood Sugar Frequently and keep a log for primary care physician, you may need to adjust medication dosage with rapid weight loss.        multivitamin with minerals Tabs tablet  Take 1 tablet by mouth daily.     progesterone 200 MG capsule  Commonly known as:  PROMETRIUM  Take 200 mg by mouth at bedtime. Day 14-28 of cycle. Then stop until day 14 again.           Follow-up Information    Follow up with Valarie MerinoMARTIN,Lillia Lengel B, MD. Go on 11/11/2015.   Specialty:  General Surgery   Why:  For Post-Op Check at 9:15    Contact information:   [address] [place]  [place] [address] [phone]       Signed: Valarie Merino 10/28/2015, 9:08 AM

## 2015-10-28 NOTE — Progress Notes (Signed)
Advanced patient diet to Bariatric post op D2- 2oz protein shake sip over 1hr.  No nausea/vomiting noted at this time. Will continue to monitor

## 2015-10-28 NOTE — Discharge Instructions (Signed)

## 2015-10-28 NOTE — Progress Notes (Signed)
Patient alert and oriented, pain is controlled. Patient is tolerating fluids,  advanced to protein shake today, patient tolerated well. Reviewed Gastric Bypass discharge instructions with patient and patient is able to articulate understanding. Provided information on BELT program, Support Group and [facility] outpatient pharmacy. All questions answered, will continue to monitor.    

## 2015-11-08 ENCOUNTER — Telehealth (HOSPITAL_COMMUNITY): Payer: Self-pay

## 2015-11-08 NOTE — Telephone Encounter (Addendum)
Attempted DROP discharge call, no answer, left message to return call, patient returned call   Made discharge phone call to patient per DROP protocol. Asking the following questions.    1. Do you have someone to care for you now that you are home?  yes 2. Are you having pain now that is not relieved by your pain medication?  no 3. Are you able to drink the recommended daily amount of fluids (48 ounces minimum/day) and protein (60-80 grams/day) as prescribed by the dietitian or nutritional counselor?  yes 4. Are you taking the vitamins and minerals as prescribed?  yes 5. Do you have the "on call" number to contact your surgeon if you have a problem or question?  yes 6. Are your incisions free of redness, swelling or drainage? (If steri strips, address that these can fall off, shower as tolerated) yes 7. Have your bowels moved since your surgery?  If not, are you passing gas?  yes 8. Are you up and walking 3-4 times per day?  yes

## 2015-11-09 ENCOUNTER — Encounter: Payer: 59 | Attending: Surgery

## 2015-11-09 DIAGNOSIS — Z6841 Body Mass Index (BMI) 40.0 and over, adult: Secondary | ICD-10-CM | POA: Insufficient documentation

## 2015-11-09 DIAGNOSIS — Z713 Dietary counseling and surveillance: Secondary | ICD-10-CM | POA: Diagnosis not present

## 2015-11-09 NOTE — Progress Notes (Signed)
Bariatric Class:  Appt start time: 1530 end time:  1630.  2 Week Post-Operative Nutrition Class  Patient was seen on 11/09/15 for Post-Operative Nutrition education at [facility].   Surgery date: 10/26/2015 Surgery type: RYGB Start weight at [facility]: 303 lbs on 02/09/2015, 305.5 lbs on 10/04/15 Weight today: 284.5 lbs  Weight change: 21 lbs   TANITA  BODY COMP RESULTS  10/04/15 11/09/15   BMI (kg/m^2) 47.8 44.6   Fat Mass (lbs) 159 141.5   Fat Free Mass (lbs) 146.5 143.0   Total Body Water (lbs) 107 104.5    The following the learning objectives were met by the patient during this course:  Identifies Phase 3A (Soft, High Proteins) Dietary Goals and will begin from 2 weeks post-operatively to 2 months post-operatively  Identifies appropriate sources of fluids and proteins   States protein recommendations and appropriate sources post-operatively  Identifies the need for appropriate texture modifications, mastication, and bite sizes when consuming solids  Identifies appropriate multivitamin and calcium sources post-operatively  Describes the need for physical activity post-operatively and will follow MD recommendations  States when to call healthcare provider regarding medication questions or post-operative complications  Handouts given during class include:  Phase 3A: Soft, High Protein Diet Handout  Follow-Up Plan: Patient will follow-up at [facility] in 6 weeks for 2 month post-op nutrition visit for diet advancement per MD.

## 2015-12-21 ENCOUNTER — Encounter: Payer: Self-pay | Admitting: Dietician

## 2015-12-21 ENCOUNTER — Encounter: Payer: 59 | Attending: Surgery | Admitting: Dietician

## 2015-12-21 DIAGNOSIS — Z6841 Body Mass Index (BMI) 40.0 and over, adult: Secondary | ICD-10-CM | POA: Insufficient documentation

## 2015-12-21 DIAGNOSIS — Z713 Dietary counseling and surveillance: Secondary | ICD-10-CM | POA: Diagnosis not present

## 2015-12-21 NOTE — Patient Instructions (Signed)
Goals:  Follow Phase 3B: High Protein + Non-Starchy Vegetables  Eat 3-6 small meals/snacks, every 3-5 hrs  Increase lean protein foods to meet 60g goal  Increase fluid intake to 64oz +  Avoid drinking 15 minutes before, during and 30 minutes after eating  Aim for >30 min of physical activity daily  Practice celebrating with non food rewards   Surgery date: 10/26/2015 Surgery type: RYGB [facility] lbs on 02/09/2015, 305.5 lbs on 10/04/15 Weight today: 264 lbs Weight change: 20.5 lbs Total weight loss: 41.5 lbs  TANITA  BODY COMP RESULTS  10/04/15 11/09/15 12/21/15   BMI (kg/m^2) 47.8 44.6 41.3   Fat Mass (lbs) 159 141.5 131.2   Fat Free Mass (lbs) 146.5 143.0 132.8   Total Body Water (lbs) 107 104.5 98.4

## 2015-12-21 NOTE — Progress Notes (Signed)
  Follow-up visit:  8 Weeks Post-Operative RYGB Surgery  Medical Nutrition Therapy:  Appt start time: 0955 end time:  1035  Primary concerns today: Post-operative Bariatric Surgery Nutrition Management.  Kaylee Roberts returns today having lost a total of 41 pounds. She reports feeling very well. Going to the gym and doing water aerobics 3x a week. Has added a few soft vegetables. Does not tolerate spinach and sometimes does not tolerate eggs. Tolerates broccoli and asparagus. Had a hard time on vacation finding foods to eat at restaurants, vomited after eating too fast or taking too big a bite.   Surgery date: 10/26/2015 Surgery type: [facility]YGB [facility] lbs on 02/09/2015, 305.5 lbs on 10/04/15 Weight today: 264 lbs Weight change: 20.5 lbs Total weight loss: 41.5 lbs   TANITA  BODY COMP RESULTS  10/04/15 11/09/15 12/21/15   BMI (kg/m^2) 47.8 44.6 41.3   Fat Mass (lbs) 159 141.5 131.2   Fat Free Mass (lbs) 146.5 143.0 132.8   Total Body Water (lbs) 107 104.5 98.4    Preferred Learning Style:   No preference indicated   Learning Readiness:   Ready  24-hr recall: B (AM): Atkins bar OR egg whites with low fat cheese and [place]key bacon (14-20g) Snk (AM):   L (PM): 2-3 oz grilled chicken or tuna pouch (17-21g) Snk (PM): Beanitos cheese curls snack packs (6g)  D (PM): 2-3 oz beef with broccoli (14-21g) Snk (PM): sugar free popsicle or jello with sugar free cool whip, sometimes beef jerky  Fluid intake: water, decaf unsweet tea with Truvia, crystal light, decaf coffee (64+ oz most days) Estimated total protein intake: 60+ grams per day per patient; has a Premier protein shake on days she exercises  Medications: see list  Supplementation: taking  Using straws: yes, feels like she has more gas when she does not use straws Drinking while eating: no Hair loss: none, taking Biotin Carbonated beverages: none N/V/D/C: vomiting after eating/drinking too fast; no constipation  reported Dumping syndrome: yes, with Halo Top ice cream   Recent physical activity:  4x a week weight training and water aerobics  Progress Towards Goal(s):  In progress.  Handouts given during visit include:  Phase 3B lean protein + non starchy vegetables   Nutritional Diagnosis:  [place]-3.3 Overweight/obesity related to past poor dietary habits and physical inactivity as evidenced by patient w/ recent RYGB surgery following dietary guidelines for continued weight loss.     Intervention:  Nutrition counseling provided.  Teaching Method Utilized:  Visual Auditory Hands on  Barriers to learning/adherence to lifestyle change: none  Demonstrated degree of understanding via:  Teach Back   Monitoring/Evaluation:  Dietary intake, exercise, and body weight. Follow up in 2 months for 4 month post-op visit.

## 2016-02-22 ENCOUNTER — Encounter: Payer: 59 | Attending: Surgery | Admitting: Dietician

## 2016-02-22 ENCOUNTER — Encounter: Payer: Self-pay | Admitting: Dietician

## 2016-02-22 DIAGNOSIS — Z6841 Body Mass Index (BMI) 40.0 and over, adult: Secondary | ICD-10-CM | POA: Diagnosis not present

## 2016-02-22 DIAGNOSIS — Z713 Dietary counseling and surveillance: Secondary | ICD-10-CM | POA: Diagnosis not present

## 2016-02-22 NOTE — Patient Instructions (Addendum)
Goals:  Follow Phase 3B: High Protein + Non-Starchy Vegetables  Eat 3-6 small meals/snacks, every 3-5 hrs  Increase lean protein foods to meet 60g goal  Increase fluid intake to 64oz +  Avoid drinking 15 minutes before, during and 30 minutes after eating  Aim for >30 min of physical activity daily  Practice celebrating with non food rewards  Pre-portion and pre-plan a snack to enjoy in the evenings  Edamame, pistachios, beef jerky, veggies and Austria[place] yogurt dip  Brainstorm some ways to manage stress (self care!)   Kickboxing, mani/pedi, reading, massage, etc.  Surgery date: 10/26/2015 Surgery type: RYGB [facility]: 303 lbs on 02/09/2015, 305.5 lbs on 10/04/15 Weight today: 243 lbs Weight change: 21 lbs Total weight loss: 62.5 lbs   TANITA  BODY COMP RESULTS  10/04/15 11/09/15 12/21/15 02/22/16   BMI (kg/m^2) 47.8 44.6 41.3 38.1   Fat Mass (lbs) 159 141.5 131.2 109.4   Fat Free Mass (lbs) 146.5 143.0 132.8 133.5   Total Body Water (lbs) 107 104.5 98.4 98.2

## 2016-02-22 NOTE — Progress Notes (Signed)
  Follow-up visit:  4 months Post-Operative RYGB Surgery  Medical Nutrition Therapy:  Appt start time: 1000 end time:  1045  Primary concerns today: Post-operative Bariatric Surgery Nutrition Management.  Ollen GrossMarjorie returns today having lost another 21 pounds. She has been noticing some weight loss plateaus that last for about a week or a week and a half. She reports that she starts watching portion sizes and being more mindful of what foods she chooses. She does not skip meals when she does not see the scale move. Has been going to the gym 5-6x a week. Husband got laid off from work and she has been very stressed.   Samples provided and patient instructed on proper use: Celebrate Iron chew (pineapple - qty 2) Lot#: [id number] Exp: 09/2017  Surgery date: 10/26/2015 Surgery type: RYGB [facility] lbs on 02/09/2015, 305.5 lbs on 10/04/15 Weight today: 243 lbs Weight change: 21 lbs Total weight loss: 62.5 lbs   TANITA  BODY COMP RESULTS  10/04/15 11/09/15 12/21/15 02/22/16   BMI (kg/m^2) 47.8 44.6 41.3 38.1   Fat Mass (lbs) 159 141.5 131.2 109.4   Fat Free Mass (lbs) 146.5 143.0 132.8 133.5   Total Body Water (lbs) 107 104.5 98.4 98.2    Preferred Learning Style:   No preference indicated   Learning Readiness:   Ready  24-hr recall: B (AM): Atkins bar (12g) Gym 5-6x a week Snk (AM):  Premier protein shake (30g) L (PM): 2-3 oz grilled chicken on salad (14-21g) Snk (PM):  D (PM): 2-3 oz beef with vegetables (14-21g) Snk (PM): sugar free popsicle or jello with sugar free cool whip, sometimes beef jerky  Fluid intake: water, decaf unsweet tea with Truvia, crystal light, decaf coffee (64+ oz most days) Estimated total protein intake: 60+ grams per day per patient; has a Premier protein shake on days she exercises  Medications: see list  Supplementation: taking, also taking copper and zinc  Using straws: yes, feels like she has more gas when she does not use  straws Drinking while eating: no Hair loss: yes, taking Biotin Carbonated beverages: none N/V/D/C: none Dumping syndrome: none  Recent physical activity:  5-6x a week (deep water aerobics, Body Pump, HIT/LIT training, Barre)  Progress Towards Goal(s):  In progress.  Handouts given during visit include:  none   Nutritional Diagnosis:  [place]-3.3 Overweight/obesity related to past poor dietary habits and physical inactivity as evidenced by patient w/ recent RYGB surgery following dietary guidelines for continued weight loss.     Intervention:  Nutrition counseling provided.  Teaching Method Utilized:  Visual Auditory Hands on  Barriers to learning/adherence to lifestyle change: none  Demonstrated degree of understanding via:  Teach Back   Monitoring/Evaluation:  Dietary intake, exercise, and body weight. Follow up in 6 weeks for 6 month post-op visit.

## 2016-04-05 ENCOUNTER — Ambulatory Visit: Payer: 59 | Admitting: Dietician

## 2016-04-11 ENCOUNTER — Encounter: Payer: 59 | Attending: Surgery | Admitting: Dietician

## 2016-04-11 DIAGNOSIS — Z6841 Body Mass Index (BMI) 40.0 and over, adult: Secondary | ICD-10-CM | POA: Diagnosis not present

## 2016-04-11 DIAGNOSIS — Z713 Dietary counseling and surveillance: Secondary | ICD-10-CM | POA: Insufficient documentation

## 2016-04-11 NOTE — Patient Instructions (Addendum)
Goals:  Follow Phase 3B: High Protein + Non-Starchy Vegetables  Eat 3-6 small meals/snacks, every 3-5 hrs  Increase lean protein foods to meet 60g goal  Increase fluid intake to 64oz +  Avoid drinking 15 minutes before, during and 30 minutes after eating  Aim for >30 min of physical activity daily  Get back into exercise routine  Practice celebrating with non food rewards  Get back into your routine!  Make sure to have breakfast  Pre-portion and pre-plan a snack to enjoy in the evenings  Edamame, pistachios, beef jerky, veggies and [place] yogurt dip  Brainstorm some ways to manage stress (self care!)   Kickboxing, mani/pedi, reading, massage, etc.  Victories: maintaining weight on vacation and making healthy decisions on vacation  Surgery date: 10/26/2015 Surgery type: [facility]YGB [facility] lbs on 02/09/2015, 305.5 lbs on 10/04/15 Weight today: 228.6 lbs Weight change: 14.4 lbs Total weight loss: 77 lbs   TANITA  BODY COMP RESULTS  10/04/15 11/09/15 12/21/15 02/22/16 04/11/16   BMI (kg/m^2) 47.8 44.6 41.3 38.1 35.8   Fat Mass (lbs) 159 141.5 131.2 109.4 97.6   Fat Free Mass (lbs) 146.5 143.0 132.8 133.5 131   Total Body Water (lbs) 107 104.5 98.4 98.2 95.6

## 2016-04-11 NOTE — Progress Notes (Signed)
  Follow-up visit:  5.5 months Post-Operative RYGB Surgery  Medical Nutrition Therapy:  Appt start time: 325 end time:  355  Primary concerns today: Post-operative Bariatric Surgery Nutrition Management.  Kaylee Roberts returns today having lost another 14 pounds. Excited that her periods have been regular for the past 3 months. Plans to wait a year post op and possibly try to have another baby. Has been on 2 vacations in the past few months. Her husband still has not found another job and this is stressful for her family. She reports she has lost 35 inches. Having trouble with iron supplements (stomach pain) and takes one every other day.   Surgery date: 10/26/2015 Surgery type: RYGB [facility] lbs on 02/09/2015, 305.5 lbs on 10/04/15 Weight today: 228.6 lbs Weight change: 14.4 lbs Total weight loss: 77 lbs   TANITA  BODY COMP RESULTS  10/04/15 11/09/15 12/21/15 02/22/16 04/11/16   BMI (kg/m^2) 47.8 44.6 41.3 38.1 35.8   Fat Mass (lbs) 159 141.5 131.2 109.4 97.6   Fat Free Mass (lbs) 146.5 143.0 132.8 133.5 131   Total Body Water (lbs) 107 104.5 98.4 98.2 95.6    Preferred Learning Style:   No preference indicated   Learning Readiness:   Ready  24-hr recall: B (AM): Atkins bar (12g) Gym 5-6x a week Snk (AM):  Premier protein shake (30g) L (PM): 2-3 oz grilled chicken on salad (14-21g) Snk (PM):  D (PM): 2-3 oz beef with vegetables (14-21g) Snk (PM): sugar free popsicle or jello with sugar free cool whip, sometimes beef jerky  Fluid intake: water, decaf unsweet tea with Truvia, crystal light, decaf coffee (64+ oz most days) Estimated total protein intake: 60+ grams per day per patient; has a Premier protein shake on days she exercises  Medications: see list  Supplementation: taking, also taking copper and zinc  Using straws: yes, feels like she has more gas when she does not use straws Drinking while eating: no Hair loss: yes, taking Biotin Carbonated beverages:  none N/V/D/C: none Dumping syndrome: 1x with Halo Top ice cream  Recent physical activity:  5-6x a week (deep water aerobics, Body Pump, HIT/LIT training, Barre)  Progress Towards Goal(s):  In progress.  Handouts given during visit include:  none   Nutritional Diagnosis:  [place]-3.3 Overweight/obesity related to past poor dietary habits and physical inactivity as evidenced by patient w/ recent RYGB surgery following dietary guidelines for continued weight loss.     Intervention:  Nutrition counseling provided.  Teaching Method Utilized:  Visual Auditory Hands on  Barriers to learning/adherence to lifestyle change: none  Demonstrated degree of understanding via:  Teach Back   Monitoring/Evaluation:  Dietary intake, exercise, and body weight. Follow up in 6 weeks for 7 month post-op visit.

## 2016-04-12 ENCOUNTER — Encounter: Payer: Self-pay | Admitting: Dietician

## 2016-05-25 ENCOUNTER — Emergency Department (HOSPITAL_COMMUNITY): Payer: 59

## 2016-05-25 ENCOUNTER — Encounter (HOSPITAL_COMMUNITY): Payer: Self-pay | Admitting: Radiology

## 2016-05-25 ENCOUNTER — Inpatient Hospital Stay (HOSPITAL_COMMUNITY)
Admission: EM | Admit: 2016-05-25 | Discharge: 2016-06-01 | DRG: 418 | Disposition: A | Discharge status: Home / Self Care | Payer: 59 | Attending: Family Medicine | Admitting: Family Medicine

## 2016-05-25 DIAGNOSIS — K644 Residual hemorrhoidal skin tags: Secondary | ICD-10-CM | POA: Diagnosis present

## 2016-05-25 DIAGNOSIS — D649 Anemia, unspecified: Secondary | ICD-10-CM | POA: Diagnosis present

## 2016-05-25 DIAGNOSIS — R509 Fever, unspecified: Secondary | ICD-10-CM | POA: Diagnosis not present

## 2016-05-25 DIAGNOSIS — Z419 Encounter for procedure for purposes other than remedying health state, unspecified: Secondary | ICD-10-CM

## 2016-05-25 DIAGNOSIS — K625 Hemorrhage of anus and rectum: Secondary | ICD-10-CM | POA: Diagnosis present

## 2016-05-25 DIAGNOSIS — R1010 Upper abdominal pain, unspecified: Secondary | ICD-10-CM

## 2016-05-25 DIAGNOSIS — K219 Gastro-esophageal reflux disease without esophagitis: Secondary | ICD-10-CM | POA: Diagnosis present

## 2016-05-25 DIAGNOSIS — Z87891 Personal history of nicotine dependence: Secondary | ICD-10-CM

## 2016-05-25 DIAGNOSIS — R945 Abnormal results of liver function studies: Secondary | ICD-10-CM

## 2016-05-25 DIAGNOSIS — K8010 Calculus of gallbladder with chronic cholecystitis without obstruction: Secondary | ICD-10-CM | POA: Diagnosis not present

## 2016-05-25 DIAGNOSIS — K648 Other hemorrhoids: Secondary | ICD-10-CM | POA: Diagnosis present

## 2016-05-25 DIAGNOSIS — R7989 Other specified abnormal findings of blood chemistry: Secondary | ICD-10-CM

## 2016-05-25 DIAGNOSIS — Z9884 Bariatric surgery status: Secondary | ICD-10-CM

## 2016-05-25 DIAGNOSIS — G43909 Migraine, unspecified, not intractable, without status migrainosus: Secondary | ICD-10-CM | POA: Diagnosis present

## 2016-05-25 DIAGNOSIS — R410 Disorientation, unspecified: Secondary | ICD-10-CM

## 2016-05-25 DIAGNOSIS — E876 Hypokalemia: Secondary | ICD-10-CM | POA: Diagnosis present

## 2016-05-25 DIAGNOSIS — G4733 Obstructive sleep apnea (adult) (pediatric): Secondary | ICD-10-CM | POA: Diagnosis present

## 2016-05-25 DIAGNOSIS — R112 Nausea with vomiting, unspecified: Secondary | ICD-10-CM

## 2016-05-25 DIAGNOSIS — R1030 Lower abdominal pain, unspecified: Secondary | ICD-10-CM

## 2016-05-25 LAB — TYPE AND SCREEN
ABO/RH(D): A POS
Antibody Screen: NEGATIVE

## 2016-05-25 LAB — RAPID URINE DRUG SCREEN, HOSP PERFORMED
Amphetamines: NOT DETECTED
BENZODIAZEPINES: NOT DETECTED
Barbiturates: NOT DETECTED
COCAINE: NOT DETECTED
Opiates: NOT DETECTED
Tetrahydrocannabinol: NOT DETECTED

## 2016-05-25 LAB — CBC
HCT: 38.3 % (ref 36.0–46.0)
Hemoglobin: 12.4 g/dL (ref 12.0–15.0)
MCH: 26.6 pg (ref 26.0–34.0)
MCHC: 32.4 g/dL (ref 30.0–36.0)
MCV: 82.2 fL (ref 78.0–100.0)
Platelets: 225 10*3/uL (ref 150–400)
RBC: 4.66 MIL/uL (ref 3.87–5.11)
RDW: 14.2 % (ref 11.5–15.5)
WBC: 7.1 10*3/uL (ref 4.0–10.5)

## 2016-05-25 LAB — COMPREHENSIVE METABOLIC PANEL
ALT: 14 U/L (ref 14–54)
AST: 18 U/L (ref 15–41)
Albumin: 4.3 g/dL (ref 3.5–5.0)
Alkaline Phosphatase: 71 U/L (ref 38–126)
Anion gap: 10 (ref 5–15)
BUN: 16 mg/dL (ref 6–20)
CO2: 23 mmol/L (ref 22–32)
Calcium: 9.4 mg/dL (ref 8.9–10.3)
Chloride: 103 mmol/L (ref 101–111)
Creatinine, Ser: 0.81 mg/dL (ref 0.44–1.00)
GFR calc Af Amer: >60 mL/min (ref >60)
GFR calc non Af Amer: >60 mL/min (ref >60)
Glucose, Bld: 100 mg/dL — ABNORMAL HIGH (ref 65–99)
Potassium: 3.4 mmol/L — ABNORMAL LOW (ref 3.5–5.1)
Sodium: 136 mmol/L (ref 135–145)
Total Bilirubin: 1.2 mg/dL (ref 0.3–1.2)
Total Protein: 7.5 g/dL (ref 6.5–8.1)

## 2016-05-25 LAB — URINALYSIS, ROUTINE W REFLEX MICROSCOPIC
Bilirubin Urine: NEGATIVE
Glucose, UA: NEGATIVE mg/dL
Hgb urine dipstick: NEGATIVE
Ketones, ur: 15 mg/dL — AB
Leukocytes, UA: NEGATIVE
Nitrite: NEGATIVE
Protein, ur: NEGATIVE mg/dL
Specific Gravity, Urine: 1.022 (ref 1.005–1.030)
pH: 7.5 (ref 5.0–8.0)

## 2016-05-25 LAB — MAGNESIUM: Magnesium: 1.7 mg/dL (ref 1.7–2.4)

## 2016-05-25 LAB — POC OCCULT BLOOD, ED: Fecal Occult Bld: POSITIVE — AB

## 2016-05-25 LAB — ABO/RH: ABO/RH(D): A POS

## 2016-05-25 LAB — WET PREP, GENITAL
Clue Cells Wet Prep HPF POC: NONE SEEN
Sperm: NONE SEEN
Trich, Wet Prep: NONE SEEN
Yeast Wet Prep HPF POC: NONE SEEN

## 2016-05-25 LAB — I-STAT BETA HCG BLOOD, ED (MC, WL, AP ONLY): I-stat hCG, quantitative: <5 m[IU]/mL (ref <5)

## 2016-05-25 LAB — CBG MONITORING, ED: GLUCOSE-CAPILLARY: 98 mg/dL (ref 65–99)

## 2016-05-25 LAB — I-STAT CG4 LACTIC ACID, ED
Lactic Acid, Venous: 1.25 mmol/L (ref 0.5–1.9)
Lactic Acid, Venous: 0.49 mmol/L — ABNORMAL LOW (ref 0.5–1.9)

## 2016-05-25 LAB — PHOSPHORUS: PHOSPHORUS: 3.9 mg/dL (ref 2.5–4.6)

## 2016-05-25 MED ORDER — ONDANSETRON 4 MG PO TBDP
4.0000 mg | ORAL_TABLET | Freq: Three times a day (TID) | ORAL | Status: AC | PRN
  Administered 2016-05-25: 4 mg via ORAL
  Filled 2016-05-25: qty 1

## 2016-05-25 MED ORDER — ADULT MULTIVITAMIN W/MINERALS CH
1.0000 | ORAL_TABLET | Freq: Every day | ORAL | Status: DC
  Administered 2016-05-26 – 2016-06-01 (×4): 1 via ORAL
  Filled 2016-05-25 (×4): qty 1

## 2016-05-25 MED ORDER — ONDANSETRON 4 MG PO TBDP
4.0000 mg | ORAL_TABLET | Freq: Once | ORAL | Status: AC | PRN
  Administered 2016-05-25: 4 mg via ORAL
  Filled 2016-05-25: qty 1

## 2016-05-25 MED ORDER — THIAMINE HCL 100 MG/ML IJ SOLN
100.0000 mg | INTRAMUSCULAR | Status: AC
  Administered 2016-05-25: 100 mg via INTRAVENOUS
  Filled 2016-05-25: qty 2

## 2016-05-25 MED ORDER — IOPAMIDOL (ISOVUE-300) INJECTION 61%
100.0000 mL | Freq: Once | INTRAVENOUS | Status: AC | PRN
  Administered 2016-05-25: 100 mL via INTRAVENOUS

## 2016-05-25 MED ORDER — BIOTIN 1 MG PO CAPS: 1.0000 mg | ORAL_CAPSULE | Freq: Every day | ORAL | Status: DC

## 2016-05-25 MED ORDER — PANTOPRAZOLE SODIUM 40 MG PO TBEC
40.0000 mg | DELAYED_RELEASE_TABLET | Freq: Every day | ORAL | Status: DC
  Administered 2016-05-26 – 2016-05-27 (×2): 40 mg via ORAL
  Filled 2016-05-25 (×2): qty 1

## 2016-05-25 MED ORDER — PROMETHAZINE HCL 25 MG/ML IJ SOLN
12.5000 mg | Freq: Once | INTRAMUSCULAR | Status: AC
  Administered 2016-05-25: 12.5 mg via INTRAVENOUS
  Filled 2016-05-25: qty 1

## 2016-05-25 MED ORDER — ACETAMINOPHEN 500 MG PO TABS
1000.0000 mg | ORAL_TABLET | Freq: Once | ORAL | Status: AC
  Administered 2016-05-25: 1000 mg via ORAL
  Filled 2016-05-25: qty 2

## 2016-05-25 MED ORDER — OXYCODONE-ACETAMINOPHEN 5-325 MG PO TABS
1.0000 | ORAL_TABLET | ORAL | Status: DC | PRN
  Administered 2016-05-25 – 2016-06-01 (×6): 2 via ORAL
  Filled 2016-05-25 (×6): qty 2

## 2016-05-25 MED ORDER — ALBUTEROL SULFATE (2.5 MG/3ML) 0.083% IN NEBU: 2.5000 mg | INHALATION_SOLUTION | Freq: Four times a day (QID) | RESPIRATORY_TRACT | Status: DC | PRN

## 2016-05-25 MED ORDER — HYDROMORPHONE HCL 1 MG/ML IJ SOLN
1.0000 mg | Freq: Once | INTRAMUSCULAR | Status: AC
  Administered 2016-05-25: 1 mg via INTRAVENOUS
  Filled 2016-05-25: qty 1

## 2016-05-25 MED ORDER — IOPAMIDOL (ISOVUE-300) INJECTION 61%
INTRAVENOUS | Status: AC
  Filled 2016-05-25: qty 100

## 2016-05-25 MED ORDER — LORAZEPAM 2 MG/ML IJ SOLN: 1.0000 mg | Freq: Once | INTRAMUSCULAR | Status: DC

## 2016-05-25 MED ORDER — SODIUM CHLORIDE 0.9 % IV SOLN
INTRAVENOUS | Status: AC
  Administered 2016-05-25 – 2016-05-26 (×2): via INTRAVENOUS
  Filled 2016-05-25 (×4): qty 1000

## 2016-05-25 MED ORDER — LIDOCAINE HCL (PF) 1 % IJ SOLN: 5.0000 mL | Freq: Once | INTRAMUSCULAR | Status: DC

## 2016-05-25 MED ORDER — FERROUS SULFATE 325 (65 FE) MG PO TABS
325.0000 mg | ORAL_TABLET | Freq: Every day | ORAL | Status: DC
  Administered 2016-05-26 – 2016-05-27 (×2): 325 mg via ORAL
  Filled 2016-05-25 (×2): qty 1

## 2016-05-25 MED ORDER — SODIUM CHLORIDE 0.9 % IV BOLUS (SEPSIS)
1000.0000 mL | Freq: Once | INTRAVENOUS | Status: AC
  Administered 2016-05-25: 1000 mL via INTRAVENOUS

## 2016-05-25 MED ORDER — ALBUTEROL SULFATE HFA 108 (90 BASE) MCG/ACT IN AERS: 2.0000 | INHALATION_SPRAY | Freq: Four times a day (QID) | RESPIRATORY_TRACT | Status: DC | PRN

## 2016-05-25 MED ORDER — SODIUM CHLORIDE 0.9 % IJ SOLN
INTRAMUSCULAR | Status: AC
  Filled 2016-05-25: qty 50

## 2016-05-25 NOTE — Progress Notes (Signed)
PHARMACIST - PHYSICIAN ORDER COMMUNICATION  CONCERNING: P&T Medication Policy on Herbal Medications  DESCRIPTION:  This patient's order for:  Biotin  has been noted.  This product(s) is classified as an "herbal" or natural product. Due to a lack of definitive safety studies or FDA approval, nonstandard manufacturing practices, plus the potential risk of unknown drug-drug interactions while on inpatient medications, the Pharmacy and Therapeutics Committee does not permit the use of "herbal" or natural products of this type within [facility].   ACTION TAKEN: The pharmacy department is unable to verify this order at this time and your patient has been informed of this safety policy. Please reevaluate patient's clinical condition at discharge and address if the herbal or natural product(s) should be resumed at that time.  Dorna LeitzAnh Biran Mayberry, PharmD, BCPS 05/25/2016 7:50 PM

## 2016-05-25 NOTE — H&P (Signed)
History and Physical   Kaylee Roberts WUJ:[id number] DOB: 1980-12-08 DOA: 05/25/2016  Referring MD/NP/PA: Dr. Wilson Singer, [place] PCP: Nance Pear., NP Outpatient Specialists: Dr. Hassell Done, bariatric surgery, [facility]  Patient coming from: Home  Chief Complaint: Abdominal pain  HPI: Kaylee Roberts is a 35 y.o. female with medical history significant for gastric bypass presenting for abdominal pain and rectal bleeding. She repots 2 days of bright red blood with normal BMs and worsening, severe, "aching" constant abdominal pain worst in the LLQ without radiation. There is no effect of eating/drinking or urination/defecation. She called her surgeon's office this morning and was told to have labs checked, but pain worsened to the point she needed to come to the ED. Today she's also had pink-tinged foamy emesis x1 and nausea improved with antiemetics in the ED. Denies poor po, sick contacts. Has been compliant with multivitamins.  ED Course: On arrival she appeared uncomfortable but in no distress, tachycardic with diffuse abdominal tenderness worst in LUQ. Rectal temperature was 102.80F. Rectal exam showed gross blood, nonthrombosed external hemorrhoids and tenderness with DRE. CBC and CMP were normal with hgb 12.4, FOBT positive, ketonuria without infection in UA. UPT negative. Lactate normal, blood cultures obtained. CT abdomen showed stable splenomegaly without explanation of pain. Pain was relieved by dilaudid IV, phenergan, and zofran. EDP reported odd affect at time of his exam, see note for further details. [facility] asked to bring in for observation.   Review of Systems: No cough, dysuria, and per HPI. All others reviewed and are negative.   Past Medical History:  Diagnosis Date  . Asthma    exercise induced asthma  . Complication of anesthesia    "i cry a lot"  . Family history of adverse reaction to anesthesia    mom gets combative  . GERD (gastroesophageal reflux disease)   . H/O MTHFR  mutation   . Headache    hx migraines  . History of chicken pox   . Osteoarthritis   . PCOS (polycystic ovarian syndrome)   . Sleep apnea    unable to tolerate mask    Past Surgical History:  Procedure Laterality Date  . CESAREAN SECTION  2010 & 2013  . FRACTURE SURGERY  1990   nasal  . GASTRIC ROUX-EN-Y N/A 10/26/2015   Procedure: LAPAROSCOPIC ROUX-EN-Y GASTRIC BYPASS WITH UPPER ENDOSCOPY;  Surgeon: Johnathan Hausen, MD;  Location: [facility];  Service: General;  Laterality: N/A;  . REFRACTIVE SURGERY     - Nonsmoker, no EtOH or illicit drugs. From [place], came down with husband of 7 years due to his job as a Government social research officer. They met online. She had 2 boys, ages 7 and 77, by C-section. Was at bible study this morning when symptoms became intolerable. Has been on prozac in the past due to anxiety which resolved and is no longer treated.  Allergies  Allergen Reactions  . Nsaids Other (See Comments)    Per pts MD she is unable to take this class of medications.    Delma Freeze [Fexofenadine] Palpitations and Rash  . Claritin [Loratadine] Palpitations and Rash  . Tetracyclines & Related Palpitations and Rash    Family History  Problem Relation Age of Onset  . Arthritis Mother   . Diabetes Mother   . Hypertension Father   . Arthritis Maternal Grandmother   . Stroke Maternal Grandmother   . Alcohol abuse Maternal Grandfather   . Diabetes Maternal Grandfather   . Hyperlipidemia Paternal Grandmother   . Heart disease Paternal Grandmother   .  Hypertension Paternal Grandmother   . Hyperlipidemia Paternal Grandfather   . Heart disease Paternal Grandfather   . Hypertension Paternal Grandfather   . Bleeding Disorder Sister   . Depression Sister    - Family history otherwise reviewed and not pertinent. Prior to Admission medications   Medication Sig Start Date End Date Taking? Authorizing Provider  albuterol (PROVENTIL HFA;VENTOLIN HFA) 108 (90 BASE) MCG/ACT inhaler Inhale 2 puffs  into the lungs every 6 (six) hours as needed for wheezing or shortness of breath. 03/31/15  Yes Debbrah Alar, NP  Biotin 1 MG CAPS Take 1 mg by mouth daily.   Yes Historical Provider, MD  ferrous sulfate 325 (65 FE) MG tablet Take 325 mg by mouth daily with breakfast.   Yes Historical Provider, MD  Multiple Vitamin (MULTIVITAMIN WITH MINERALS) TABS tablet Take 1 tablet by mouth daily.   Yes Historical Provider, MD  NIACINAMIDE-ZINC-COPPER-FA PO Take 1 tablet by mouth daily.   Yes Historical Provider, MD  oxymetazoline (AFRIN) 0.05 % nasal spray Place 1 spray into both nostrils 2 (two) times daily as needed for congestion.   Yes Historical Provider, MD  pantoprazole (PROTONIX) 40 MG tablet Take 40 mg by mouth daily.   Yes Historical Provider, MD  pseudoephedrine (SUDAFED) 120 MG 12 hr tablet Take 120 mg by mouth every 12 (twelve) hours as needed for congestion.   Yes Historical Provider, MD    Physical Exam: Vitals:   05/25/16 1400 05/25/16 1403 05/25/16 1630 05/25/16 1702  BP: 99/60 99/60 (!) 98/51 (!) 96/54  Pulse: 98 99  90  Resp: _0 Temp:      TempSrc:      SpO2: 99% 100%  100%   Constitutional: 35 y.o. female in no distress, calm demeanor Eyes: Lids and conjunctivae normal, PERRL ENMT: Mucous membranes are moist. Posterior pharynx clear of any exudate or lesions. Normal dentition.  Neck: normal, supple, no masses, no thyromegaly Respiratory: Non-labored breathing without accessory muscle use, 99-100% with 2L O2 without desaturation when removed from O2. Clear breath sounds to auscultation bilaterally Cardiovascular: Regular rate and rhythm, no murmurs, rubs, or gallops. No carotid bruits. No JVD. No LE edema. 2+ pedal pulses. Abdomen: Normoactive bowel sounds. Diffusely tender, particularly left of umbilicus without rebound or guarding. Spleen palpated but no other masses.  GU: No indwelling catheter Musculoskeletal: No clubbing / cyanosis. No joint deformity upper and  lower extremities. Good ROM, no contractures. Normal muscle tone.  Skin: Warm, dry. No rashes, wounds, no ulcers. Laparoscopic scars on abdomen well healed. Neurologic: CN II-XII grossly intact. Gait not assessed. Speech normal. No focal deficits in motor strength or sensation in all extremities. DTRs 2+. No meningismus.   Psychiatric: Alert and oriented x3. Normal judgment and insight.   Labs on Admission: I have personally reviewed following labs and imaging studies  CBC:  Recent Labs Lab 05/25/16 1151  WBC 7.1  HGB 12.4  HCT 38.3  MCV 82.2  PLT 381   Basic Metabolic Panel:  Recent Labs Lab 05/25/16 1151  NA 136  K 3.4*  CL 103  CO2 23  GLUCOSE 100*  BUN 16  CREATININE 0.81  CALCIUM 9.4   Liver Function Tests:  Recent Labs Lab 05/25/16 1151  AST 18  ALT 14  ALKPHOS 71  BILITOT 1.2  PROT 7.5  ALBUMIN 4.3   CBG:  Recent Labs Lab 05/25/16 1244  GLUCAP 98   Urine analysis:    Component Value Date/Time   COLORURINE  YELLOW 05/25/2016 [place] 05/25/2016 1524   LABSPEC 1.022 05/25/2016 1524   PHURINE 7.5 05/25/2016 1524   GLUCOSEU NEGATIVE 05/25/2016 1524   HGBUR NEGATIVE 05/25/2016 1524   [place] 05/25/2016 1524   KETONESUR 15 (A) 05/25/2016 1524   PROTEINUR NEGATIVE 05/25/2016 1524   NITRITE NEGATIVE 05/25/2016 1524   LEUKOCYTESUR NEGATIVE 05/25/2016 1524    Recent Results (from the past 240 hour(s))  Wet prep, genital     Status: Abnormal   Collection Time: 05/25/16  4:50 PM  Result Value Ref Range Status   Yeast Wet Prep HPF POC NONE SEEN NONE SEEN Final   Trich, Wet Prep NONE SEEN NONE SEEN Final   Clue Cells Wet Prep HPF POC NONE SEEN NONE SEEN Final   WBC, Wet Prep HPF POC FEW (A) NONE SEEN Final   Sperm NONE SEEN  Final    Radiological Exams on Admission: Ct Abdomen Pelvis W Contrast  Result Date: 05/25/2016 CLINICAL DATA:  Left-sided abdominal and rectal pain EXAM: CT ABDOMEN AND PELVIS WITH CONTRAST  TECHNIQUE: Multidetector CT imaging of the abdomen and pelvis was performed using the standard protocol following bolus administration of intravenous contrast. CONTRAST:  185m ISOVUE-300 IOPAMIDOL (ISOVUE-300) INJECTION 61% COMPARISON:  None. FINDINGS: Lower chest:  No contributory findings. Hepatobiliary: No focal liver abnormality.No evidence of biliary obstruction or stone. Pancreas: Unremarkable. Spleen: Splenomegaly and without focal mass. The spleen measures up to 15 cm, stable from 2016 sonography. No cirrhotic changes in the liver. Adrenals/Urinary Tract: Negative adrenals. No hydronephrosis or stone. Unremarkable bladder. Stomach/Bowel: Gastric bypass without obstruction or marginal inflammation. No appendicitis. Vascular/Lymphatic: No acute vascular abnormality. No mass or adenopathy. Reproductive:No pathologic findings. Other: No ascites or pneumoperitoneum. Musculoskeletal: No acute or aggressive finding. Disc degeneration with narrowing and endplate spurring greatest at L5-S1. IMPRESSION: 1. No explanation for pain. 2. Splenomegaly that is stable from 2016 sonography Electronically Signed   By: JMonte FantasiaM.D.   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 CT abd/pelvis essentially normal without evidence of anastomosis inflammation or incarceration. Infectious labs normal. Exam shows tenderness without peritoneal signs. - Discussed with Dr. WRedmond Pulling general surgery, who advised labs to be checked, reviewed scan, and agreed to have Gen Surg see in AM.  SIRS: Febrile, tachycardic, and hypotensive with single episode of tachypnea in ED. Does not appear septic and no source found with work up in ED. With headache, AMS, consideration given to LP, but AMS resolved and headache consistent with history of migraines without meningitic signs on exam. - Monitor blood  cultures - Hold abx. - Neuro checks overnight - Low threshold for LP depending on clinical course  Rectal bleeding: With + FOBT. Likely related to hemorrhoids. No evidence of colitis on CT. Low suspicion for large internal bleeding without anemia or catharsis.  - Monitor CBC - Type and screened  Odd affect: As described by EDP, resolved on my encounter in the absence of husband. Pt states she feels safe, denies any abusive history. Remote history of anxiety, but no active mental health issues documented. Suspect this could be due to administration of zofran, phenergan, dilaudid as pt does not recall several hours in the ED. - Monitor closely - Give thiamine IV STAT in ED. - Check for deficiencies that are more likely s/p bypass.   DVT prophylaxis:  SCDs, early ambulation w/assistance  Code Status: Full  Family Communication: None at bedside, EDP spoke with family. Disposition Plan: Observe overnight for declaration of source of SIRS and abdominal pain.  Consults called: General surgery  Admission status: Observation   Vance Gather, MD [facility] Pager [phone]  If 7PM-7AM, please contact night-coverage [email or web address] Password [email or web address] 05/25/2016, 6:15 PM

## 2016-05-25 NOTE — ED Notes (Signed)
Pt's O2 sats down to 83% after pain medication, placed on 2L via n/c, O2 96%.

## 2016-05-25 NOTE — Progress Notes (Deleted)
History and Physical   Kaylee Roberts WUJ:[id number] DOB: 1980-12-08 DOA: 05/25/2016  Referring MD/NP/PA: Dr. Wilson Singer, [place] PCP: Nance Pear., NP Outpatient Specialists: Dr. Hassell Done, bariatric surgery, [facility]  Patient coming from: Home  Chief Complaint: Abdominal pain  HPI: Kaylee Roberts is a 35 y.o. female with medical history significant for gastric bypass presenting for abdominal pain and rectal bleeding. She repots 2 days of bright red blood with normal BMs and worsening, severe, "aching" constant abdominal pain worst in the LLQ without radiation. There is no effect of eating/drinking or urination/defecation. She called her surgeon's office this morning and was told to have labs checked, but pain worsened to the point she needed to come to the ED. Today she's also had pink-tinged foamy emesis x1 and nausea improved with antiemetics in the ED. Denies poor po, sick contacts. Has been compliant with multivitamins.  ED Course: On arrival she appeared uncomfortable but in no distress, tachycardic with diffuse abdominal tenderness worst in LUQ. Rectal temperature was 102.80F. Rectal exam showed gross blood, nonthrombosed external hemorrhoids and tenderness with DRE. CBC and CMP were normal with hgb 12.4, FOBT positive, ketonuria without infection in UA. UPT negative. Lactate normal, blood cultures obtained. CT abdomen showed stable splenomegaly without explanation of pain. Pain was relieved by dilaudid IV, phenergan, and zofran. EDP reported odd affect at time of his exam, see note for further details. [facility] asked to bring in for observation.   Review of Systems: No cough, dysuria, and per HPI. All others reviewed and are negative.   Past Medical History:  Diagnosis Date  . Asthma    exercise induced asthma  . Complication of anesthesia    "i cry a lot"  . Family history of adverse reaction to anesthesia    mom gets combative  . GERD (gastroesophageal reflux disease)   . H/O MTHFR  mutation   . Headache    hx migraines  . History of chicken pox   . Osteoarthritis   . PCOS (polycystic ovarian syndrome)   . Sleep apnea    unable to tolerate mask    Past Surgical History:  Procedure Laterality Date  . CESAREAN SECTION  2010 & 2013  . FRACTURE SURGERY  1990   nasal  . GASTRIC ROUX-EN-Y N/A 10/26/2015   Procedure: LAPAROSCOPIC ROUX-EN-Y GASTRIC BYPASS WITH UPPER ENDOSCOPY;  Surgeon: Johnathan Hausen, MD;  Location: [facility];  Service: General;  Laterality: N/A;  . REFRACTIVE SURGERY     - Nonsmoker, no EtOH or illicit drugs. From [place], came down with husband of 7 years due to his job as a [employer] social research officer. They met online. She had 2 boys, ages 7 and 77, by C-section. Was at bible study this morning when symptoms became intolerable. Has been on prozac in the past due to anxiety which resolved and is no longer treated.  Allergies  Allergen Reactions  . Nsaids Other (See Comments)    Per pts MD she is unable to take this class of medications.    Delma Freeze [Fexofenadine] Palpitations and Rash  . Claritin [Loratadine] Palpitations and Rash  . Tetracyclines & Related Palpitations and Rash    Family History  Problem Relation Age of Onset  . Arthritis Mother   . Diabetes Mother   . Hypertension Father   . Arthritis Maternal Grandmother   . Stroke Maternal Grandmother   . Alcohol abuse Maternal Grandfather   . Diabetes Maternal Grandfather   . Hyperlipidemia Paternal Grandmother   . Heart disease Paternal Grandmother   .  Hypertension Paternal Grandmother   . Hyperlipidemia Paternal Grandfather   . Heart disease Paternal Grandfather   . Hypertension Paternal Grandfather   . Bleeding Disorder Sister   . Depression Sister    - Family history otherwise reviewed and not pertinent. Prior to Admission medications   Medication Sig Start Date End Date Taking? Authorizing Provider  albuterol (PROVENTIL HFA;VENTOLIN HFA) 108 (90 BASE) MCG/ACT inhaler Inhale 2 puffs  into the lungs every 6 (six) hours as needed for wheezing or shortness of breath. 03/31/15  Yes Debbrah Alar, NP  Biotin 1 MG CAPS Take 1 mg by mouth daily.   Yes Historical Provider, MD  ferrous sulfate 325 (65 FE) MG tablet Take 325 mg by mouth daily with breakfast.   Yes Historical Provider, MD  Multiple Vitamin (MULTIVITAMIN WITH MINERALS) TABS tablet Take 1 tablet by mouth daily.   Yes Historical Provider, MD  NIACINAMIDE-ZINC-COPPER-FA PO Take 1 tablet by mouth daily.   Yes Historical Provider, MD  oxymetazoline (AFRIN) 0.05 % nasal spray Place 1 spray into both nostrils 2 (two) times daily as needed for congestion.   Yes Historical Provider, MD  pantoprazole (PROTONIX) 40 MG tablet Take 40 mg by mouth daily.   Yes Historical Provider, MD  pseudoephedrine (SUDAFED) 120 MG 12 hr tablet Take 120 mg by mouth every 12 (twelve) hours as needed for congestion.   Yes Historical Provider, MD    Physical Exam: Vitals:   05/25/16 1400 05/25/16 1403 05/25/16 1630 05/25/16 1702  BP: 99/60 99/60 (!) 98/51 (!) 96/54  Pulse: 98 99  90  Resp: _0 Temp:      TempSrc:      SpO2: 99% 100%  100%   Constitutional: 35 y.o. female in no distress, calm demeanor Eyes: Lids and conjunctivae normal, PERRL ENMT: Mucous membranes are moist. Posterior pharynx clear of any exudate or lesions. Normal dentition.  Neck: normal, supple, no masses, no thyromegaly Respiratory: Non-labored breathing without accessory muscle use, 99-100% with 2L O2 without desaturation when removed from O2. Clear breath sounds to auscultation bilaterally Cardiovascular: Regular rate and rhythm, no murmurs, rubs, or gallops. No carotid bruits. No JVD. No LE edema. 2+ pedal pulses. Abdomen: Normoactive bowel sounds. Diffusely tender, particularly left of umbilicus without rebound or guarding. Spleen palpated but no other masses.  GU: No indwelling catheter Musculoskeletal: No clubbing / cyanosis. No joint deformity upper and  lower extremities. Good ROM, no contractures. Normal muscle tone.  Skin: Warm, dry. No rashes, wounds, no ulcers. Laparoscopic scars on abdomen well healed. Neurologic: CN II-XII grossly intact. Gait not assessed. Speech normal. No focal deficits in motor strength or sensation in all extremities. DTRs 2+. No meningismus.   Psychiatric: Alert and oriented x3. Normal judgment and insight.   Labs on Admission: I have personally reviewed following labs and imaging studies  CBC:  Recent Labs Lab 05/25/16 1151  WBC 7.1  HGB 12.4  HCT 38.3  MCV 82.2  PLT 381   Basic Metabolic Panel:  Recent Labs Lab 05/25/16 1151  NA 136  K 3.4*  CL 103  CO2 23  GLUCOSE 100*  BUN 16  CREATININE 0.81  CALCIUM 9.4   Liver Function Tests:  Recent Labs Lab 05/25/16 1151  AST 18  ALT 14  ALKPHOS 71  BILITOT 1.2  PROT 7.5  ALBUMIN 4.3   CBG:  Recent Labs Lab 05/25/16 1244  GLUCAP 98   Urine analysis:    Component Value Date/Time   COLORURINE  YELLOW 05/25/2016 [place] 05/25/2016 1524   LABSPEC 1.022 05/25/2016 1524   PHURINE 7.5 05/25/2016 1524   GLUCOSEU NEGATIVE 05/25/2016 1524   HGBUR NEGATIVE 05/25/2016 1524   [place] 05/25/2016 1524   KETONESUR 15 (A) 05/25/2016 1524   PROTEINUR NEGATIVE 05/25/2016 1524   NITRITE NEGATIVE 05/25/2016 1524   LEUKOCYTESUR NEGATIVE 05/25/2016 1524    Recent Results (from the past 240 hour(s))  Wet prep, genital     Status: Abnormal   Collection Time: 05/25/16  4:50 PM  Result Value Ref Range Status   Yeast Wet Prep HPF POC NONE SEEN NONE SEEN Final   Trich, Wet Prep NONE SEEN NONE SEEN Final   Clue Cells Wet Prep HPF POC NONE SEEN NONE SEEN Final   WBC, Wet Prep HPF POC FEW (A) NONE SEEN Final   Sperm NONE SEEN  Final    Radiological Exams on Admission: Ct Abdomen Pelvis W Contrast  Result Date: 05/25/2016 CLINICAL DATA:  Left-sided abdominal and rectal pain EXAM: CT ABDOMEN AND PELVIS WITH CONTRAST  TECHNIQUE: Multidetector CT imaging of the abdomen and pelvis was performed using the standard protocol following bolus administration of intravenous contrast. CONTRAST:  185m ISOVUE-300 IOPAMIDOL (ISOVUE-300) INJECTION 61% COMPARISON:  None. FINDINGS: Lower chest:  No contributory findings. Hepatobiliary: No focal liver abnormality.No evidence of biliary obstruction or stone. Pancreas: Unremarkable. Spleen: Splenomegaly and without focal mass. The spleen measures up to 15 cm, stable from 2016 sonography. No cirrhotic changes in the liver. Adrenals/Urinary Tract: Negative adrenals. No hydronephrosis or stone. Unremarkable bladder. Stomach/Bowel: Gastric bypass without obstruction or marginal inflammation. No appendicitis. Vascular/Lymphatic: No acute vascular abnormality. No mass or adenopathy. Reproductive:No pathologic findings. Other: No ascites or pneumoperitoneum. Musculoskeletal: No acute or aggressive finding. Disc degeneration with narrowing and endplate spurring greatest at L5-S1. IMPRESSION: 1. No explanation for pain. 2. Splenomegaly that is stable from 2016 sonography Electronically Signed   By: JMonte FantasiaM.D.   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 CT abd/pelvis essentially normal without evidence of anastomosis inflammation or incarceration. Infectious labs normal. Exam shows tenderness without peritoneal signs. - Discussed with Dr. WRedmond Pulling general surgery, who advised labs to be checked, reviewed scan, and agreed to have Gen Surg see in AM.  SIRS: Febrile, tachycardic, and hypotensive with single episode of tachypnea in ED. Does not appear septic and no source found with work up in ED. With headache, AMS, consideration given to LP, but AMS resolved and headache consistent with history of migraines without meningitic signs on exam. - Monitor blood  cultures - Hold abx. - Neuro checks overnight - Low threshold for LP depending on clinical course  Rectal bleeding: With + FOBT. Likely related to hemorrhoids. No evidence of colitis on CT. Low suspicion for large internal bleeding without anemia or catharsis.  - Monitor CBC - Type and screened  Odd affect: As described by EDP, resolved on my encounter in the absence of husband. Pt states she feels safe, denies any abusive history. Remote history of anxiety, but no active mental health issues documented. Suspect this could be due to administration of zofran, phenergan, dilaudid as pt does not recall several hours in the ED. - Monitor closely - Give thiamine IV STAT in ED. - Check for deficiencies that are more likely s/p bypass.   DVT prophylaxis:  SCDs, early ambulation w/assistance  Code Status: Full  Family Communication: None at bedside, EDP spoke with family. Disposition Plan: Observe overnight for declaration of source of SIRS and abdominal pain.  Consults called: General surgery  Admission status: Observation   Sahas Sluka, MD [facility] Pager [phone]  If 7PM-7AM, please contact night-coverage [email or web address] Password [email or web address] 05/25/2016, 6:15 PM   

## 2016-05-25 NOTE — ED Provider Notes (Addendum)
[facility]-EMERGENCY DEPT Provider Note    By signing my name below, I, Kaylee Roberts, attest that this documentation has been prepared under the direction and in the presence of Kaylee RazorStephen Jenalee Trevizo, MD. Electronically Signed: Earmon PhoenixJennifer Roberts, ED Scribe. 05/25/16. 12:55 PM.   History   Chief Complaint Chief Complaint  Patient presents with  . Abdominal Pain  . Emesis  . Rectal Bleeding    The history is provided by the patient and medical records. No language interpreter was used.    HPI Comments:  Kaylee Roberts is an obese 35 y.o. female s/p gastric bypass surgery 7 months ago and hemorrhoids, who presents to the Emergency Department complaining of rectal bleeding and rectal pain for the past two days. She reports about one teaspoon of bright red blood mixed in with the stool at first, but now the blood is darker. She reports abdominal pain, chills, nausea and vomiting pink substance, but denies eating anything pink. She reports tingling in her face. She also reports hemorrhoids and painful bowel movements. She describes the pain as pressure. She states she called her surgeon, Dr. Daphine DeutscherMartin who seemed unconcerned. She has not taken anything for her symptoms. She denies modifying factors. She states she has had two previous cesarean sections.   Past Medical History:  Diagnosis Date  . Asthma    exercise induced asthma  . Complication of anesthesia    "i cry a lot"  . Family history of adverse reaction to anesthesia    mom gets combative  . GERD (gastroesophageal reflux disease)   . H/O MTHFR mutation   . Headache    hx migraines  . History of chicken pox   . Osteoarthritis   . PCOS (polycystic ovarian syndrome)   . Sleep apnea    unable to tolerate mask    Patient Active Problem List   Diagnosis Date Noted  . 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 Anxiety and depression 04/02/2015  . OSA (obstructive sleep apnea) 04/02/2015  . Morbid obesity (HCC) 04/02/2015  .  PCOS (polycystic ovarian syndrome) 04/02/2015  . Exercise-induced asthma 04/02/2015  . Migraine 04/02/2015    Past Surgical History:  Procedure Laterality Date  . CESAREAN SECTION  2010 & 2013  . FRACTURE SURGERY  1990   nasal  . GASTRIC ROUX-EN-Y N/A 10/26/2015   Procedure: LAPAROSCOPIC ROUX-EN-Y GASTRIC BYPASS WITH UPPER ENDOSCOPY;  Surgeon: Luretha MurphyMatthew Martin, MD;  Location: [facility] ORS;  Service: General;  Laterality: N/A;  . REFRACTIVE SURGERY      OB History    No data available       Home Medications    Prior to Admission medications   Medication Sig Start Date End Date Taking? Authorizing Provider  albuterol (PROVENTIL HFA;VENTOLIN HFA) 108 (90 BASE) MCG/ACT inhaler Inhale 2 puffs into the lungs every 6 (six) hours as needed for wheezing or shortness of breath. 03/31/15  Yes Sandford CrazeMelissa O'Sullivan, NP  Biotin 1 MG CAPS Take 1 mg by mouth daily.   Yes Historical Provider, MD  ferrous sulfate 325 (65 FE) MG tablet Take 325 mg by mouth daily with breakfast.   Yes Historical Provider, MD  Multiple Vitamin (MULTIVITAMIN WITH MINERALS) TABS tablet Take 1 tablet by mouth daily.   Yes Historical Provider, MD  NIACINAMIDE-ZINC-COPPER-FA PO Take 1 tablet by mouth daily.   Yes Historical Provider, MD  oxymetazoline (AFRIN) 0.05 % nasal spray Place 1 spray into both nostrils 2 (two) times daily as needed for congestion.  Yes Historical Provider, MD  pantoprazole (PROTONIX) 40 MG tablet Take 40 mg by mouth daily.   Yes Historical Provider, MD  pseudoephedrine (SUDAFED) 120 MG 12 hr tablet Take 120 mg by mouth every 12 (twelve) hours as needed for congestion.   Yes Historical Provider, MD    Family History Family History  Problem Relation Age of Onset  . Arthritis Mother   . Diabetes Mother   . Hypertension Father   . Arthritis Maternal Grandmother   . Stroke Maternal Grandmother   . Alcohol abuse Maternal Grandfather   . Diabetes Maternal Grandfather   . Hyperlipidemia Paternal Grandmother    . Heart disease Paternal Grandmother   . Hypertension Paternal Grandmother   . Hyperlipidemia Paternal Grandfather   . Heart disease Paternal Grandfather   . Hypertension Paternal Grandfather   . Bleeding Disorder Sister   . Depression Sister     Social History Social History  Substance Use Topics  . Smoking status: Former Smoker    Quit date: 10/12/2005  . Smokeless tobacco: Not on file  . Alcohol use No     Allergies   Nsaids; Allegra [fexofenadine]; Claritin [loratadine]; and Tetracyclines & related   Review of Systems Review of Systems A complete 10 system review of systems was obtained and all systems are negative except as noted in the HPI and PMH.    Physical Exam Updated Vital Signs BP (!) 144/116 (BP Location: Left Arm)   Pulse 110   Temp 98.2 F (36.8 C) (Oral)   Resp 21   SpO2 100%   Physical Exam  Constitutional: She is oriented to person, place, and time. She appears well-developed and well-nourished. No distress.  Appears uncomfortable.  HENT:  Head: Normocephalic and atraumatic.  Eyes: EOM are normal.  Neck: Normal range of motion.  Cardiovascular: Regular rhythm and normal heart sounds.  Tachycardia present.   Pulmonary/Chest: Effort normal and breath sounds normal.  Abdominal: Soft. She exhibits no distension. There is tenderness.  Diffusely tender. Focally worse in LUQ.  Genitourinary:  Genitourinary Comments: External hemorrhoid, non thrombosed. Gross blood noted. Significant tenderness with DRE. Exam chaperoned by female medical student.  Chaperone present for pelvic exam. No concerning lesions noted.  Mild whitish vaginal discharge. No CMT or adnexal tenderness.   Musculoskeletal: Normal range of motion.  Neurological: She is alert and oriented to person, place, and time.  Skin: Skin is warm and dry.  Psychiatric: She has a normal mood and affect. Judgment normal.  Nursing note and vitals reviewed.    ED Treatments / Results    DIAGNOSTIC STUDIES: Oxygen Saturation is 100% on RA, normal by my interpretation.   COORDINATION OF CARE: 12:05 PM- Will order pain and nausea medication. Will perform rectal exam and order imaging. Pt verbalizes understanding and agrees to plan.  Medications  iopamidol (ISOVUE-300) 61 % injection (not administered)  sodium chloride 0.9 % injection (not administered)  ondansetron (ZOFRAN-ODT) disintegrating tablet 4 mg (4 mg Oral Given 05/25/16 1121)  sodium chloride 0.9 % bolus 1,000 mL (0 mLs Intravenous Stopped 05/25/16 1651)  promethazine (PHENERGAN) injection 12.5 mg (12.5 mg Intravenous Given 05/25/16 1257)  HYDROmorphone (DILAUDID) injection 1 mg (1 mg Intravenous Given 05/25/16 1256)  iopamidol (ISOVUE-300) 61 % injection 100 mL (100 mLs Intravenous Contrast Given 05/25/16 1424)  acetaminophen (TYLENOL) tablet 1,000 mg (1,000 mg Oral Given 05/25/16 1349)   Labs (all labs ordered are listed, but only abnormal results are displayed) Labs Reviewed  WET PREP, GENITAL - Abnormal; Notable  for the following:       Result Value   WBC, Wet Prep HPF POC FEW (*)    All other components within normal limits  COMPREHENSIVE METABOLIC PANEL - Abnormal; Notable for the following:    Potassium 3.4 (*)    Glucose, Bld 100 (*)    All other components within normal limits  URINALYSIS, ROUTINE W REFLEX MICROSCOPIC (NOT AT [facility]) - Abnormal; Notable for the following:    Ketones, ur 15 (*)    All other components within normal limits  POC OCCULT BLOOD, ED - Abnormal; Notable for the following:    Fecal Occult Bld POSITIVE (*)    All other components within normal limits  I-STAT CG4 LACTIC ACID, ED - Abnormal; Notable for the following:    Lactic Acid, Venous 0.49 (*)    All other components within normal limits  CULTURE, BLOOD (ROUTINE X 2)  CULTURE, BLOOD (ROUTINE X 2)  CBC  HIV ANTIBODY (ROUTINE TESTING)  CBG MONITORING, ED  I-STAT CG4 LACTIC ACID, ED  I-STAT BETA HCG BLOOD, ED ([facility],  [facility], [facility] ONLY)  TYPE AND SCREEN  ABO/RH  GC/CHLAMYDIA PROBE AMP ([facility]) NOT AT [facility]    EKG  EKG Interpretation None       Radiology Ct Abdomen Pelvis W Contrast  Result Date: 05/25/2016 CLINICAL DATA:  Left-sided abdominal and rectal pain EXAM: CT ABDOMEN AND PELVIS WITH CONTRAST TECHNIQUE: Multidetector CT imaging of the abdomen and pelvis was performed using the standard protocol following bolus administration of intravenous contrast. CONTRAST:  ISOVUE-300 IOPAMIDOL (ISOVUE-300) INJECTION 61% COMPARISON:  None. FINDINGS: Lower chest:  No contributory findings. Hepatobiliary: No focal liver abnormality.No evidence of biliary obstruction or stone. Pancreas: Unremarkable. Spleen: Splenomegaly and without focal mass. The spleen measures up to 15 cm, stable from 2016 sonography. No cirrhotic changes in the liver. Adrenals/Urinary Tract: Negative adrenals. No hydronephrosis or stone. Unremarkable bladder. Stomach/Bowel: Gastric bypass without obstruction or marginal inflammation. No appendicitis. Vascular/Lymphatic: No acute vascular abnormality. No mass or adenopathy. Reproductive:No pathologic findings. Other: No ascites or pneumoperitoneum. Musculoskeletal: No acute or aggressive finding. Disc degeneration with narrowing and endplate spurring greatest at L5-S1. IMPRESSION: 1. No explanation for pain. 2. Splenomegaly that is stable from 2016 sonography Electronically Signed   By: Marnee Spring M.D.   On: 05/25/2016 14:55    Procedures Procedures (including critical care time)  Medications Ordered in ED Medications  iopamidol (ISOVUE-300) 61 % injection (not administered)  sodium chloride 0.9 % injection (not administered)  ondansetron (ZOFRAN-ODT) disintegrating tablet 4 mg (4 mg Oral Given 05/25/16 1121)  sodium chloride 0.9 % bolus 1,000 mL (0 mLs Intravenous Stopped 05/25/16 1651)  promethazine (PHENERGAN) injection 12.5 mg (12.5 mg Intravenous Given 05/25/16 1257)   HYDROmorphone (DILAUDID) injection 1 mg (1 mg Intravenous Given 05/25/16 1256)  iopamidol (ISOVUE-300) 61 % injection 100 mL (100 mLs Intravenous Contrast Given 05/25/16 1424)  acetaminophen (TYLENOL) tablet 1,000 mg (1,000 mg Oral Given 05/25/16 1349)     Initial Impression / Assessment and Plan / ED Course  I have reviewed the triage vital signs and the nursing notes.  Pertinent labs & imaging results that were available during my care of the patient were reviewed by me and considered in my medical decision making (see chart for details).  Clinical Course     35yF with L sided abdominal pain, rectal bleeding. Unclear etiology. W/u fairly unremarkable.  I don't have a clear explanation of her symptoms. Does have external hemorrhoids which may  explain bleeding, but doesn't explain abdominal pain and fever.    Pt also with really bizarre behavior in ED. Mental status seemingly changing rapidly. Moaning and barely answering questions and then will be lucid. One moment she says she didn't recognize her husband or know his name but then later  "remember(ed) not remembering." Says she doesn't known children's names, but telling me earlier she has had previous c-section. Then subsequently told me she was unaware she had a c-section but able to tell me she had "high grade dysplasia" on previous pap smear when I was explaining she needed a pelvic examination.   I really suspect there is a psychogenic component, but she does have a fever w/o a source at this point. She denies vaginal discharge. There is an odd dynamic between her and her husband and I spoke to her with him outside the room. She denies any specific concerns for possible STD, but without a clear source of abdominal/pelvic pain will do the pelvic.   She is HD stable. Labs thus far pretty unremarkable. She denies HA or neck pain. No nuchal rigidity. Initially thought about LP, but I really doubt this is meningitis.   I personally  preformed the services scribed in my presence. The recorded information has been reviewed is accurate. Kaylee Razor, MD.   Final Clinical Impressions(s) / ED Diagnoses   Final diagnoses:  Lower abdominal pain  Fever, unspecified fever cause  Acute confusion    New Prescriptions New Prescriptions   No medications on file       Kaylee Razor, MD 05/25/16 1707

## 2016-05-25 NOTE — ED Notes (Signed)
Pt with an altered mental status, keeps asking where is she and why, can't tell me her name. Dr Juleen ChinaKohut at bedside

## 2016-05-25 NOTE — ED Notes (Signed)
RN to start IV 

## 2016-05-25 NOTE — ED Triage Notes (Signed)
Pt reports rectal bleeding x2 days and abd pain and n/v that started this AM. Pt had gastric by last April. Pt denies any immediate post-op complications. Pt dry heaving in triage. Pt A+OX4, speaking in complete sentences, and anxious.

## 2016-05-26 ENCOUNTER — Telehealth: Payer: Self-pay | Admitting: Family

## 2016-05-26 DIAGNOSIS — G43909 Migraine, unspecified, not intractable, without status migrainosus: Secondary | ICD-10-CM | POA: Diagnosis present

## 2016-05-26 DIAGNOSIS — G4733 Obstructive sleep apnea (adult) (pediatric): Secondary | ICD-10-CM | POA: Diagnosis present

## 2016-05-26 DIAGNOSIS — R509 Fever, unspecified: Secondary | ICD-10-CM | POA: Diagnosis present

## 2016-05-26 DIAGNOSIS — R1084 Generalized abdominal pain: Secondary | ICD-10-CM | POA: Diagnosis not present

## 2016-05-26 DIAGNOSIS — K219 Gastro-esophageal reflux disease without esophagitis: Secondary | ICD-10-CM | POA: Diagnosis present

## 2016-05-26 DIAGNOSIS — K8050 Calculus of bile duct without cholangitis or cholecystitis without obstruction: Secondary | ICD-10-CM | POA: Diagnosis not present

## 2016-05-26 DIAGNOSIS — E876 Hypokalemia: Secondary | ICD-10-CM | POA: Diagnosis present

## 2016-05-26 DIAGNOSIS — R1032 Left lower quadrant pain: Secondary | ICD-10-CM

## 2016-05-26 DIAGNOSIS — R112 Nausea with vomiting, unspecified: Secondary | ICD-10-CM | POA: Diagnosis not present

## 2016-05-26 DIAGNOSIS — Z87891 Personal history of nicotine dependence: Secondary | ICD-10-CM | POA: Diagnosis not present

## 2016-05-26 DIAGNOSIS — K644 Residual hemorrhoidal skin tags: Secondary | ICD-10-CM | POA: Diagnosis present

## 2016-05-26 DIAGNOSIS — R1010 Upper abdominal pain, unspecified: Secondary | ICD-10-CM | POA: Diagnosis not present

## 2016-05-26 DIAGNOSIS — R1030 Lower abdominal pain, unspecified: Secondary | ICD-10-CM | POA: Diagnosis not present

## 2016-05-26 DIAGNOSIS — D649 Anemia, unspecified: Secondary | ICD-10-CM | POA: Diagnosis present

## 2016-05-26 DIAGNOSIS — K8020 Calculus of gallbladder without cholecystitis without obstruction: Secondary | ICD-10-CM | POA: Diagnosis not present

## 2016-05-26 DIAGNOSIS — K648 Other hemorrhoids: Secondary | ICD-10-CM | POA: Diagnosis present

## 2016-05-26 DIAGNOSIS — K625 Hemorrhage of anus and rectum: Secondary | ICD-10-CM | POA: Diagnosis present

## 2016-05-26 DIAGNOSIS — Z9884 Bariatric surgery status: Secondary | ICD-10-CM | POA: Diagnosis not present

## 2016-05-26 DIAGNOSIS — K8010 Calculus of gallbladder with chronic cholecystitis without obstruction: Secondary | ICD-10-CM | POA: Diagnosis present

## 2016-05-26 LAB — CBC
HEMATOCRIT: 33.5 % — AB (ref 36.0–46.0)
HEMOGLOBIN: 10.7 g/dL — AB (ref 12.0–15.0)
MCH: 26.6 pg (ref 26.0–34.0)
MCHC: 31.9 g/dL (ref 30.0–36.0)
MCV: 83.3 fL (ref 78.0–100.0)
Platelets: 198 10*3/uL (ref 150–400)
RBC: 4.02 MIL/uL (ref 3.87–5.11)
RDW: 14.6 % (ref 11.5–15.5)
WBC: 4.8 10*3/uL (ref 4.0–10.5)

## 2016-05-26 LAB — COMPREHENSIVE METABOLIC PANEL
ALBUMIN: 3.7 g/dL (ref 3.5–5.0)
ALT: 19 U/L (ref 14–54)
ANION GAP: 6 (ref 5–15)
AST: 21 U/L (ref 15–41)
Alkaline Phosphatase: 57 U/L (ref 38–126)
BILIRUBIN TOTAL: 1.2 mg/dL (ref 0.3–1.2)
BUN: 11 mg/dL (ref 6–20)
CO2: 24 mmol/L (ref 22–32)
Calcium: 8.5 mg/dL — ABNORMAL LOW (ref 8.9–10.3)
Chloride: 107 mmol/L (ref 101–111)
Creatinine, Ser: 0.64 mg/dL (ref 0.44–1.00)
GFR calc Af Amer: >60 mL/min (ref >60)
GFR calc non Af Amer: >60 mL/min (ref >60)
GLUCOSE: 113 mg/dL — AB (ref 65–99)
POTASSIUM: 3.3 mmol/L — AB (ref 3.5–5.1)
Sodium: 137 mmol/L (ref 135–145)
TOTAL PROTEIN: 6.1 g/dL — AB (ref 6.5–8.1)

## 2016-05-26 LAB — GC/CHLAMYDIA PROBE AMP (~~LOC~~) NOT AT ARMC
Chlamydia: NEGATIVE
Neisseria Gonorrhea: NEGATIVE

## 2016-05-26 LAB — HIV ANTIBODY (ROUTINE TESTING W REFLEX): HIV Screen 4th Generation wRfx: NONREACTIVE

## 2016-05-26 LAB — VITAMIN B12: Vitamin B-12: 563 pg/mL (ref 180–914)

## 2016-05-26 MED ORDER — SODIUM CHLORIDE 0.9 % IV SOLN
INTRAVENOUS | Status: DC
  Administered 2016-05-26 – 2016-05-31 (×12): via INTRAVENOUS
  Filled 2016-05-26 (×29): qty 1000

## 2016-05-26 MED ORDER — ONDANSETRON HCL 4 MG/2ML IJ SOLN
4.0000 mg | Freq: Four times a day (QID) | INTRAMUSCULAR | Status: DC | PRN
  Administered 2016-05-26 – 2016-05-27 (×4): 4 mg via INTRAVENOUS
  Filled 2016-05-26 (×4): qty 2

## 2016-05-26 MED ORDER — METRONIDAZOLE 500 MG PO TABS
500.0000 mg | ORAL_TABLET | Freq: Three times a day (TID) | ORAL | Status: DC
  Administered 2016-05-26 – 2016-05-27 (×3): 500 mg via ORAL
  Filled 2016-05-26 (×4): qty 1

## 2016-05-26 MED ORDER — CIPROFLOXACIN IN D5W 400 MG/200ML IV SOLN
400.0000 mg | Freq: Two times a day (BID) | INTRAVENOUS | Status: DC
  Administered 2016-05-26 – 2016-05-30 (×8): 400 mg via INTRAVENOUS
  Filled 2016-05-26 (×8): qty 200

## 2016-05-26 NOTE — Telephone Encounter (Signed)
Please contact patient to arrange an ED follow up visit with me.

## 2016-05-26 NOTE — Progress Notes (Signed)
PROGRESS NOTE                                                                                                                                                                                                             Patient Demographics:    Kaylee Roberts, is a 35 y.o. female, DOB - 03-11-1981, RUE:[id number]  Admit date - 05/25/2016   Admitting Physician Tyrone Nineyan B Grunz, MD  Outpatient Primary MD for the patient is Lemont Fillers'SULLIVAN,MELISSA S., NP  LOS - 0  Outpatient Specialists: Gen Surgery Dr Daphine DeutscherMartin.  Chief Complaint  Patient presents with  . Abdominal Pain  . Emesis  . Rectal Bleeding       Brief Narrative   35 y.o. female with medical history significant for gastric bypass presenting for abdominal pain and rectal bleeding, as well febrile 102.1, with mild leukocytosis, CT abdomen and pelvis with no acute findings   Subjective:    Kaylee Roberts today has, No headache, No chest pain, Still complains of abdominal pain, and reports of bright red blood per rectum, nausea with bilious vomiting .   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  Code Status : Full  Family Communication  : D/W patient  Disposition Plan  : Home  Consults  :  Gen surgery,GI  Procedures  : None  DVT Prophylaxis  : SCDs   Lab Results  Component Value Date   PLT 198 05/26/2016    Antibiotics  :    Anti-infectives    Start     Dose/Rate Route Frequency Ordered Stop   05/26/16 1000  metroNIDAZOLE (FLAGYL) tablet  500 mg     500 mg Oral Every 8 hours 05/26/16 0801     05/26/16 0900  ciprofloxacin (CIPRO) IVPB 400 mg     400 mg 200 mL/hr over 60 Minutes Intravenous Every 12 hours 05/26/16 0801  Objective:   Vitals:   05/25/16 2148 05/25/16 2149 05/26/16 0409 05/26/16 0604  BP: 112/82 115/75  113/60  Pulse: 96 (!) 103  77  Resp:    16  Temp:   (!) 100.6 F (38.1 C) 99.3 F (37.4 C)  TempSrc:    Oral  SpO2: 96% 98%  95%    Wt Readings from Last 3 Encounters:  04/12/16 103.7 kg (228 lb 9.6 oz)  12/21/15 119.7 kg (264 lb)  11/09/15 129 kg (284 lb 8 oz)     Intake/Output Summary (Last 24 hours) at 05/26/16 1341 Last data filed at 05/26/16 1020  Gross per 24 hour  Intake             1085 ml  Output                0 ml  Net             1085 ml     Physical Exam  Awake Alert, Oriented X 3, No new F.N deficits, Normal affect [place].AT,PERRAL Supple Neck,No JVD, No cervical lymphadenopathy appriciated.  Symmetrical Chest wall movement, Good air movement bilaterally, CTAB RRR,No Gallops,Rubs or new Murmurs, No Parasternal Heave +ve B.Sounds, Abd Soft, Mild tenderness, more significant left abdomen, No rebound - guarding or rigidity. No Cyanosis, Clubbing or edema, No new Rash or bruise      Data Review:    CBC  Recent Labs Lab 05/25/16 1151 05/26/16 0419  WBC 7.1 4.8  HGB 12.4 10.7*  HCT 38.3 33.5*  PLT 225 198  MCV 82.2 83.3  MCH 26.6 26.6  MCHC 32.4 31.9  RDW 14.2 14.6    Chemistries   Recent Labs Lab 05/25/16 1151 05/25/16 2000 05/26/16 0419  NA 136  --  137  K 3.4*  --  3.3*  CL 103  --  107  CO2 23  --  24  GLUCOSE 100*  --  113*  BUN 16  --  11  CREATININE 0.81  --  0.64  CALCIUM 9.4  --  8.5*  MG  --  1.7  --   AST 18  --  21  ALT 14  --  19  ALKPHOS 71  --  57  BILITOT 1.2  --  1.2   ------------------------------------------------------------------------------------------------------------------ No results for input(s): CHOL, HDL,  LDLCALC, TRIG, CHOLHDL, LDLDIRECT in the last 72 hours.  Lab Results  Component Value Date   HGBA1C 5.1 03/31/2015   ------------------------------------------------------------------------------------------------------------------ No results for input(s): TSH, T4TOTAL, T3FREE, THYROIDAB in the last 72 hours.  Invalid input(s): FREET3 ------------------------------------------------------------------------------------------------------------------  Recent Labs  05/25/16 2000  VITAMINB12 563    Coagulation profile No results for input(s): INR, PROTIME in the last 168 hours.  No results for input(s): DDIMER in the last 72 hours.  Cardiac Enzymes No results for input(s): CKMB, TROPONINI, MYOGLOBIN in the last 168 hours.  Invalid input(s): CK ------------------------------------------------------------------------------------------------------------------ No results found for: BNP  Inpatient Medications  Scheduled Meds: . ciprofloxacin  400 mg Intravenous Q12H  . ferrous sulfate  325 mg Oral Q breakfast  . metroNIDAZOLE  500 mg Oral Q8H  . multivitamin with minerals  1 tablet Oral Daily  . pantoprazole  40 mg Oral Daily   Continuous Infusions: . sodium chloride 0.9 % 1,000 mL with potassium chloride 10 mEq infusion     PRN Meds:.albuterol, ondansetron (ZOFRAN) IV, oxyCODONE-acetaminophen  Micro Results Recent Results (from the past 240 hour(s))  Wet prep, genital     Status:  Abnormal   Collection Time: 05/25/16  4:50 PM  Result Value Ref Range Status   Yeast Wet Prep HPF POC NONE SEEN NONE SEEN Final   Trich, Wet Prep NONE SEEN NONE SEEN Final   Clue Cells Wet Prep HPF POC NONE SEEN NONE SEEN Final   WBC, Wet Prep HPF POC FEW (A) NONE SEEN Final   Sperm NONE SEEN  Final    Radiology Reports Ct Abdomen Pelvis W Contrast  Result Date: 05/25/2016 CLINICAL DATA:  Left-sided abdominal and rectal pain EXAM: CT ABDOMEN AND PELVIS WITH CONTRAST TECHNIQUE: Multidetector  CT imaging of the abdomen and pelvis was performed using the standard protocol following bolus administration of intravenous contrast. CONTRAST:  100mL ISOVUE-300 IOPAMIDOL (ISOVUE-300) INJECTION 61% COMPARISON:  None. FINDINGS: Lower chest:  No contributory findings. Hepatobiliary: No focal liver abnormality.No evidence of biliary obstruction or stone. Pancreas: Unremarkable. Spleen: Splenomegaly and without focal mass. The spleen measures up to 15 cm, stable from 2016 sonography. No cirrhotic changes in the liver. Adrenals/Urinary Tract: Negative adrenals. No hydronephrosis or stone. Unremarkable bladder. Stomach/Bowel: Gastric bypass without obstruction or marginal inflammation. No appendicitis. Vascular/Lymphatic: No acute vascular abnormality. No mass or adenopathy. Reproductive:No pathologic findings. Other: No ascites or pneumoperitoneum. Musculoskeletal: No acute or aggressive finding. Disc degeneration with narrowing and endplate spurring greatest at L5-S1. IMPRESSION: 1. No explanation for pain. 2. Splenomegaly that is stable from 2016 sonography Electronically Signed   By: Marnee SpringJonathon  Watts M.D.   On: 05/25/2016 14:55     Jeorgia Helming M.D on 05/26/2016 at 1:41 PM  Between 7am to 7pm - Pager - [phone]  After 7pm go to [email or web address] - password [email or web address]  [facility] -  Office  [phone]

## 2016-05-26 NOTE — Consult Note (Signed)
[facility] Consult: 2:08 PM 05/26/2016  LOS: 0 days    Referring Provider: Dr Randol KernElgergawy.    Primary Care Physician:  Lemont Fillers'SULLIVAN,MELISSA S., NP Primary Gastroenterologist:  unassigned     Reason for Consultation:  Hematochezia, left lower quadrant pain.   HPI: Kaylee QuestMarjorie Roberts is a 35 y.o. female.  PMH Morbid obesity.  Polycystic ovary disease.  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  Intraoperative EGD by Dr. Andrey CampanileWilson for evaluation of anastomosis: anastomotic width of 2.5 cm, no mucosal abnormalities noted.  Lumbosacral DDD. Splenomegaly.  Obstructive sleep apnea but never tolerated BiPAP, overall improved since gastric bypass. GERD. Exercise-induced asthma.  C-sections in 2010 and 2013.  When she was in college, in her late teens versus early 4520s, she underwent upper endoscopy for reflux symptoms and did not have ulcers.  2 days ago, in the morning, she developed acute onset LLQ pain and bloody diarrhea. Initially she was not nauseated but once she arrived in the ED and through today, she has had emesis. Initially there were some pink tinge to vomit but now it is just bilious and not bloody. She hasn't eaten anything and the diarrhea and all stools stopped yesterday morning, thus staff has not been able to collect any stool specimens. She is on enteric contact precautions.  Diet advanced from nothing by mouth to heart healhy today.   In ED rectal temperature of 102.21F. Nonthrombosed external hemorrhoids, gross blood on DRE. Hemoglobin 12.4, white blood cell count normal. Lactate normal. Pain relieved by Dilaudid, Phenergan and Zofran. Within the last 24 hours the pain is controlled with oral Percocet. Contrasted CT scan abdomen pelvis showed stable splenomegaly compared with 2016 but no etiology for patient's  pain and bleeding. Hgb 12.4 >> 10.7.  Normal MCV, WBCs, platelets. Chemistries reveal mild hypokalemia. Normal renal function. Normal LFTs.  Family history positive for a younger sister with ulcerative colitis. This developed when she was in her late 7520s and developed after gastric bypass. This same sister also has a history of peptic ulcer disease. Mother has a history of what sound like adenomatous colon polyps. Maternal grandfather with history of diverticulosis.   Since the gastric bypass, she has lost 90 pounds, within the last month or so the weight loss has slowed down.. She does not experience dumping syndrome. Dr. Daphine DeutscherMartin is pleased with her postoperative progress.  Previous reflux symptoms have resolved since the bypass surgery. She has no unusual bleeding or bruising, periods are regular.  No one at home is sick. She does eat fast food and food from restaurants on occasion.    Past Medical History:  Diagnosis Date  . Asthma    exercise induced asthma  . Complication of anesthesia    "i cry a lot"  . Family history of adverse reaction to anesthesia    mom gets combative  . GERD (gastroesophageal reflux disease)   . H/O MTHFR mutation   . Headache    hx migraines  . History of chicken pox   . Osteoarthritis   . PCOS (polycystic ovarian syndrome)   .  Sleep apnea    unable to tolerate mask    Past Surgical History:  Procedure Laterality Date  . CESAREAN SECTION  2010 & 2013  . FRACTURE SURGERY  1990   nasal  . GASTRIC ROUX-EN-Y N/A 10/26/2015   Procedure: LAPAROSCOPIC ROUX-EN-Y GASTRIC BYPASS WITH UPPER ENDOSCOPY;  Surgeon: Luretha MurphyMatthew Martin, MD;  Location: [facility];  Service: General;  Laterality: N/A;  . REFRACTIVE SURGERY      Prior to Admission medications   Medication Sig Start Date End Date Taking? Authorizing Provider  albuterol (PROVENTIL HFA;VENTOLIN HFA) 108 (90 BASE) MCG/ACT inhaler Inhale 2 puffs into the lungs every 6 (six) hours as needed for wheezing or shortness  of breath. 03/31/15  Yes Sandford CrazeMelissa O'Sullivan, NP  Biotin 1 MG CAPS Take 1 mg by mouth daily.   Yes Historical Provider, MD  ferrous sulfate 325 (65 FE) MG tablet Take 325 mg by mouth daily with breakfast.   Yes Historical Provider, MD  Multiple Vitamin (MULTIVITAMIN WITH MINERALS) TABS tablet Take 1 tablet by mouth daily.   Yes Historical Provider, MD  NIACINAMIDE-ZINC-COPPER-FA PO Take 1 tablet by mouth daily.   Yes Historical Provider, MD  oxymetazoline (AFRIN) 0.05 % nasal spray Place 1 spray into both nostrils 2 (two) times daily as needed for congestion.   Yes Historical Provider, MD  pantoprazole (PROTONIX) 40 MG tablet Take 40 mg by mouth daily.   Yes Historical Provider, MD  pseudoephedrine (SUDAFED) 120 MG 12 hr tablet Take 120 mg by mouth every 12 (twelve) hours as needed for congestion.   Yes Historical Provider, MD    Scheduled Meds: . ciprofloxacin  400 mg Intravenous Q12H  . ferrous sulfate  325 mg Oral Q breakfast  . metroNIDAZOLE  500 mg Oral Q8H  . multivitamin with minerals  1 tablet Oral Daily  . pantoprazole  40 mg Oral Daily   Infusions: . sodium chloride 0.9 % 1,000 mL with potassium chloride 10 mEq infusion 125 mL/hr at 05/26/16 1406   PRN Meds: albuterol, ondansetron (ZOFRAN) IV, oxyCODONE-acetaminophen   Allergies as of 05/25/2016 - Review Complete 05/25/2016  Allergen Reaction Noted  . Nsaids Other (See Comments) 05/25/2016  . Allegra [fexofenadine] Palpitations and Rash 02/09/2015  . Claritin [loratadine] Palpitations and Rash 02/09/2015  . Tetracyclines & related Palpitations and Rash 02/09/2015    Family History  Problem Relation Age of Onset  . Arthritis Mother   . Diabetes Mother   . Hypertension Father   . Arthritis Maternal Grandmother   . Stroke Maternal Grandmother   . Alcohol abuse Maternal Grandfather   . Diabetes Maternal Grandfather   . Hyperlipidemia Paternal Grandmother   . Heart disease Paternal Grandmother   . Hypertension Paternal  Grandmother   . Hyperlipidemia Paternal Grandfather   . Heart disease Paternal Grandfather   . Hypertension Paternal Grandfather   . Bleeding Disorder Sister   . Depression Sister     Social History   Social History  . Marital status: Married    Spouse name: N/A  . Number of children: N/A  . Years of education: N/A   Occupational History  . Not on file.   Social History Main Topics  . Smoking status: Former Smoker    Quit date: 10/12/2005  . Smokeless tobacco: Not on file  . Alcohol use No  . Drug use: No  . Sexual activity: Not on file   Other Topics Concern  . Not on file   Social History Narrative   2 sons born  2010 and 2013   Stay at home mom   Married   Enjoys painting/crafting, spending time with friends.  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    Completed college    REVIEW OF SYSTEMS: Constitutional:  Generally she has good energy levels and has no activity limitations. ENT:  No nose bleeds.  One cracked tooth but generally no dental issues. Pulm:  No shortness of breath or cough. CV:  No palpitations, no LE edema. No chest pain GU:  No hematuria, no frequency GI:  Per HPI Heme:  No unusual bleeding or bruising other than that described in HPI   Transfusions:  None ever. Neuro:  No headaches, no peripheral tingling or numbness Derm:  No itching, no rash or sores.  Endocrine:  No sweats or chills.  No polyuria or dysuria Immunization:  Did not inquire as to recent immunizations. She received Tdap 07/2008. Travel:  None beyond local counties in last few months.    PHYSICAL EXAM: Vital signs in last 24 hours: Vitals:   05/26/16 0409 05/26/16 0604  BP:  113/60  Pulse:  77  Resp:  16  Temp: (!) 100.6 F (38.1 C) 99.3 F (37.4 C)   Wt Readings from Last 3 Encounters:  04/12/16 103.7 kg (228 lb 9.6 oz)  12/21/15 119.7 kg (264 lb)  11/09/15 129 kg (284 lb 8 oz)    General: Overweight, pleasant, WF. She is a bit pale and slightly diaphoretic, she is  nauseated. Head:  No facial asymmetry or swelling. No signs of head trauma.  Eyes:  No scleral icterus or conjunctival pallor. EOMI. Ears:  No hearing loss.  Nose:  No discharge or congestion. Mouth:  Good dentition. One cracked molar in the left lower jaw. Oral mucosa moist and clear. Tongue midline. Neck:  No JVD, no TMG, no masses. No adenopathy. Lungs:  Clear bilaterally. Excellent breath sounds. No cough. No dyspnea. Heart: RRR. No MRG. S1, S2 present. Abdomen:  Soft. Scant bowel sounds but no tympanitic or tingling bowel sounds. Mild tenderness in the left lower quadrant. No guarding or rebound. No masses. Unable to appreciate splenomegaly..   Rectal: Deferred.   Musc/Skeltl: No musculoskeletal deformities. No joint erythema or swelling. Extremities:  No CCE. Feet are warm with brisk perfusion.  Neurologic:  Patient is fully alert. She is oriented 3. She moves all 4 limbs without difficulty. Strength is full. Tremor. Skin:  No rashes, no telangiectasia, no sores.   Psych:  Pleasant. In good spirits. Calm. Speech fluid  Intake/Output from previous day: 11/16 0701 - 11/17 0700 In: 845 [I.V.:845] Out: -  Intake/Output this shift: Total I/O In: 240 [P.O.:240] Out: -   LAB RESULTS:  Recent Labs  05/25/16 1151 05/26/16 0419  WBC 7.1 4.8  HGB 12.4 10.7*  HCT 38.3 33.5*  PLT 225 198   BMET Lab Results  Component Value Date   NA 137 05/26/2016   NA 136 05/25/2016   NA 140 10/13/2015   K 3.3 (L) 05/26/2016   K 3.4 (L) 05/25/2016   K 4.0 10/13/2015   CL 107 05/26/2016   CL 103 05/25/2016   CL 103 10/13/2015   CO2 24 05/26/2016   CO2 23 05/25/2016   CO2 29 10/13/2015   GLUCOSE 113 (H) 05/26/2016   GLUCOSE 100 (H) 05/25/2016   GLUCOSE 123 (H) 10/13/2015   BUN 11 05/26/2016   BUN 16 05/25/2016   BUN 11 10/13/2015   CREATININE 0.64 05/26/2016   CREATININE 0.81 05/25/2016  CREATININE 1.02 (H) 10/26/2015   CALCIUM 8.5 (L) 05/26/2016   CALCIUM 9.4 05/25/2016    CALCIUM 9.7 10/13/2015   LFT  Recent Labs  05/25/16 1151 05/26/16 0419  PROT 7.5 6.1*  ALBUMIN 4.3 3.7  AST 18 21  ALT 14 19  ALKPHOS 71 57  BILITOT 1.2 1.2   PT/INR No results found for: INR, PROTIME Hepatitis Panel No results for input(s): HEPBSAG, HCVAB, HEPAIGM, HEPBIGM in the last 72 hours. C-Diff No components found for: CDIFF Lipase  No results found for: LIPASE  Drugs of Abuse     Component Value Date/Time   LABOPIA NONE DETECTED 05/25/2016 1524   COCAINSCRNUR NONE DETECTED 05/25/2016 1524   LABBENZ NONE DETECTED 05/25/2016 1524   AMPHETMU NONE DETECTED 05/25/2016 1524   THCU NONE DETECTED 05/25/2016 1524   LABBARB NONE DETECTED 05/25/2016 1524     RADIOLOGY STUDIES: Ct Abdomen Pelvis W Contrast  Result Date: 05/25/2016 CLINICAL DATA:  Left-sided abdominal and rectal pain EXAM: CT ABDOMEN AND PELVIS WITH CONTRAST TECHNIQUE: Multidetector CT imaging of the abdomen and pelvis was performed using the standard protocol following bolus administration of intravenous contrast. CONTRAST:  ISOVUE-300 IOPAMIDOL (ISOVUE-300) INJECTION 61% COMPARISON:  None. FINDINGS: Lower chest:  No contributory findings. Hepatobiliary: No focal liver abnormality.No evidence of biliary obstruction or stone. Pancreas: Unremarkable. Spleen: Splenomegaly and without focal mass. The spleen measures up to 15 cm, stable from 2016 sonography. No cirrhotic changes in the liver. Adrenals/Urinary Tract: Negative adrenals. No hydronephrosis or stone. Unremarkable bladder. Stomach/Bowel: Gastric bypass without obstruction or marginal inflammation. No appendicitis. Vascular/Lymphatic: No acute vascular abnormality. No mass or adenopathy. Reproductive:No pathologic findings. Other: No ascites or pneumoperitoneum. Musculoskeletal: No acute or aggressive finding. Disc degeneration with narrowing and endplate spurring greatest at L5-S1. IMPRESSION: 1. No explanation for pain. 2. Splenomegaly that is stable  from 2016 sonography Electronically Signed   By: Marnee Spring M.D.   On: 05/25/2016 14:55      IMPRESSION:   *  LLQ pain and Bloody.  CT scan negative for colitis. Rule out infectious etiology, staff has been unable to collect stool sample as bloody diarrhea ceased as of the a.m. 11/17. No imaging does not can confirm colitis, the patient's younger sister developed ulcerative colitis after gastric bypass when she was in her late 62s.  Ischemic colitis is also possibility though she is young for this, does not use oral contraceptives. and has no history of ischemic/vascular conditions.  *  Status post bariatric Roux-en-Y bypass in 10/2015. Has successfully lost 90 pounds since that time.  *   Normocytic anemia. Takes oral iron at home. Due to blood loss and dilutional effect of IV hydration.  *  Hypokalemia  *  History GERD. Continues on oral Protonix.    PLAN:     *  Patient's diet was advanced from NPO to heart healthy today. She is still nauseated so will regress diet to clear liquids.  Observe for another 12-18 hours. If her symptoms have not improved by tomorrow, consider flexible sigmoidoscopy versus colonoscopy. If she remains nauseated, do not think she would tolerate a bowel prep.     Jennye Moccasin  05/26/2016, 2:08 PM Pager: [phone]

## 2016-05-26 NOTE — Progress Notes (Signed)
Patient ID: Kaylee Roberts, female   DOB: 1980-12-02, 35 y.o.   MRN: [id number] [facility] Surgery Progress Note:   * No surgery found *  Subjective: Mental status is clear.  Patient complaining of feeling bad and fever Objective: Vital signs in last 24 hours: Temp:  [98.2 F (36.8 C)-102.1 F (38.9 C)] 99.3 F (37.4 C) (11/17 0604) Pulse Rate:  [77-134] 77 (11/17 0604) Resp:  [16-35] 16 (11/17 0604) BP: (96-144)/(51-116) 113/60 (11/17 0604) SpO2:  [95 %-100 %] 95 % (11/17 0604)  Intake/Output from previous day: 11/16 0701 - 11/17 0700 In: 845 [I.V.:845] Out: -  Intake/Output this shift: No intake/output data recorded.  Physical Exam: Work of breathing is not labored.  Not complaining of significant abdominal pain at present  Lab Results:  Results for orders placed or performed during the hospital encounter of 05/25/16 (from the past 48 hour(s))  Type and screen [place]     Status: None   Collection Time: 05/25/16 11:50 AM  Result Value Ref Range   ABO/RH(D) A POS    Antibody Screen NEG    Sample Expiration 05/28/2016   Comprehensive metabolic panel     Status: Abnormal   Collection Time: 05/25/16 11:51 AM  Result Value Ref Range   Sodium 136 135 - 145 mmol/L   Potassium 3.4 (L) 3.5 - 5.1 mmol/L   Chloride 103 101 - 111 mmol/L   CO2 23 22 - 32 mmol/L   Glucose, Bld 100 (H) 65 - 99 mg/dL   BUN 16 6 - 20 mg/dL   Creatinine, Ser 0.81 0.44 - 1.00 mg/dL   Calcium 9.4 8.9 - 10.3 mg/dL   Total Protein 7.5 6.5 - 8.1 g/dL   Albumin 4.3 3.5 - 5.0 g/dL   AST 18 15 - 41 U/L   ALT 14 14 - 54 U/L   Alkaline Phosphatase 71 38 - 126 U/L   Total Bilirubin 1.2 0.3 - 1.2 mg/dL   GFR calc non Af Amer >60 >60 mL/min   GFR calc Af Amer >60 >60 mL/min    Comment: (NOTE) The eGFR has been calculated using the CKD EPI equation. This calculation has not been validated in all clinical situations. eGFR's persistently <60 mL/min signify possible Chronic  Kidney Disease.    Anion gap 10 5 - 15  CBC     Status: None   Collection Time: 05/25/16 11:51 AM  Result Value Ref Range   WBC 7.1 4.0 - 10.5 K/uL   RBC 4.66 3.87 - 5.11 MIL/uL   Hemoglobin 12.4 12.0 - 15.0 g/dL   HCT 38.3 36.0 - 46.0 %   MCV 82.2 78.0 - 100.0 fL   MCH 26.6 26.0 - 34.0 pg   MCHC 32.4 30.0 - 36.0 g/dL   RDW 14.2 11.5 - 15.5 %   Platelets 225 150 - 400 K/uL  ABO/Rh     Status: None   Collection Time: 05/25/16 11:51 AM  Result Value Ref Range   ABO/RH(D) A POS   CBG monitoring, ED     Status: None   Collection Time: 05/25/16 12:44 PM  Result Value Ref Range   Glucose-Capillary 98 65 - 99 mg/dL   Comment 1 Notify RN    Comment 2 Document in Chart   I-Stat Beta hCG blood, ED ([facility], [facility], [facility] only)     Status: None   Collection Time: 05/25/16  1:34 PM  Result Value Ref Range   I-stat hCG, quantitative <5.0 <5 mIU/mL  Comment 3            Comment:   GEST. AGE      CONC.  (mIU/mL)   <=1 WEEK        5 - 50     2 WEEKS       50 - 500     3 WEEKS       100 - 10,000     4 WEEKS     1,000 - 30,000        FEMALE AND NON-PREGNANT FEMALE:     LESS THAN 5 mIU/mL   I-Stat CG4 Lactic Acid, ED     Status: None   Collection Time: 05/25/16  1:37 PM  Result Value Ref Range   Lactic Acid, Venous 1.25 0.5 - 1.9 mmol/L  Urinalysis, Routine w reflex microscopic     Status: Abnormal   Collection Time: 05/25/16  3:24 PM  Result Value Ref Range   Color, Urine YELLOW YELLOW   APPearance CLEAR CLEAR   Specific Gravity, Urine 1.022 1.005 - 1.030   pH 7.5 5.0 - 8.0   Glucose, UA NEGATIVE NEGATIVE mg/dL   Hgb urine dipstick NEGATIVE NEGATIVE   Bilirubin Urine NEGATIVE NEGATIVE   Ketones, ur 15 (A) NEGATIVE mg/dL   Protein, ur NEGATIVE NEGATIVE mg/dL   Nitrite NEGATIVE NEGATIVE   Leukocytes, UA NEGATIVE NEGATIVE    Comment: MICROSCOPIC NOT DONE ON URINES WITH NEGATIVE PROTEIN, BLOOD, LEUKOCYTES, NITRITE, OR GLUCOSE <1000 mg/dL.  Urine rapid drug screen (hosp performed)     Status:  None   Collection Time: 05/25/16  3:24 PM  Result Value Ref Range   Opiates NONE DETECTED NONE DETECTED   Cocaine NONE DETECTED NONE DETECTED   Benzodiazepines NONE DETECTED NONE DETECTED   Amphetamines NONE DETECTED NONE DETECTED   Tetrahydrocannabinol NONE DETECTED NONE DETECTED   Barbiturates NONE DETECTED NONE DETECTED    Comment:        DRUG SCREEN FOR MEDICAL PURPOSES ONLY.  IF CONFIRMATION IS NEEDED FOR ANY PURPOSE, NOTIFY LAB WITHIN 5 DAYS.        LOWEST DETECTABLE LIMITS FOR URINE DRUG SCREEN Drug Class       Cutoff (ng/mL) Amphetamine      1000 Barbiturate      200 Benzodiazepine   614 Tricyclics       431 Opiates          300 Cocaine          300 THC              50   POC occult blood, ED     Status: Abnormal   Collection Time: 05/25/16  3:41 PM  Result Value Ref Range   Fecal Occult Bld POSITIVE (A) NEGATIVE  I-Stat CG4 Lactic Acid, ED     Status: Abnormal   Collection Time: 05/25/16  4:27 PM  Result Value Ref Range   Lactic Acid, Venous 0.49 (L) 0.5 - 1.9 mmol/L  HIV antibody (routine testing) (NOT for [facility])     Status: None   Collection Time: 05/25/16  4:41 PM  Result Value Ref Range   HIV Screen 4th Generation wRfx Non Reactive Non Reactive    Comment: (NOTE) Performed At: [facility] [address] [place], [place] [id number] Lindon Romp MD PJ:[id number]   Wet prep, genital     Status: Abnormal   Collection Time: 05/25/16  4:50 PM  Result Value Ref Range   Yeast Wet Prep HPF POC  NONE SEEN NONE SEEN   Trich, Wet Prep NONE SEEN NONE SEEN   Clue Cells Wet Prep HPF POC NONE SEEN NONE SEEN   WBC, Wet Prep HPF POC FEW (A) NONE SEEN   Sperm NONE SEEN   Magnesium     Status: None   Collection Time: 05/25/16  8:00 PM  Result Value Ref Range   Magnesium 1.7 1.7 - 2.4 mg/dL  Phosphorus     Status: None   Collection Time: 05/25/16  8:00 PM  Result Value Ref Range   Phosphorus 3.9 2.5 - 4.6 mg/dL  Vitamin B12     Status: None   Collection  Time: 05/25/16  8:00 PM  Result Value Ref Range   Vitamin B-12 563 180 - 914 pg/mL    Comment: (NOTE) This assay is not validated for testing neonatal or myeloproliferative syndrome specimens for Vitamin B12 levels. Performed at [facility]   Comprehensive metabolic panel     Status: Abnormal   Collection Time: 05/26/16  4:19 AM  Result Value Ref Range   Sodium 137 135 - 145 mmol/L   Potassium 3.3 (L) 3.5 - 5.1 mmol/L   Chloride 107 101 - 111 mmol/L   CO2 24 22 - 32 mmol/L   Glucose, Bld 113 (H) 65 - 99 mg/dL   BUN 11 6 - 20 mg/dL   Creatinine, Ser 0.64 0.44 - 1.00 mg/dL   Calcium 8.5 (L) 8.9 - 10.3 mg/dL   Total Protein 6.1 (L) 6.5 - 8.1 g/dL   Albumin 3.7 3.5 - 5.0 g/dL   AST 21 15 - 41 U/L   ALT 19 14 - 54 U/L   Alkaline Phosphatase 57 38 - 126 U/L   Total Bilirubin 1.2 0.3 - 1.2 mg/dL   GFR calc non Af Amer >60 >60 mL/min   GFR calc Af Amer >60 >60 mL/min    Comment: (NOTE) The eGFR has been calculated using the CKD EPI equation. This calculation has not been validated in all clinical situations. eGFR's persistently <60 mL/min signify possible Chronic Kidney Disease.    Anion gap 6 5 - 15  CBC     Status: Abnormal   Collection Time: 05/26/16  4:19 AM  Result Value Ref Range   WBC 4.8 4.0 - 10.5 K/uL   RBC 4.02 3.87 - 5.11 MIL/uL   Hemoglobin 10.7 (L) 12.0 - 15.0 g/dL   HCT 33.5 (L) 36.0 - 46.0 %   MCV 83.3 78.0 - 100.0 fL   MCH 26.6 26.0 - 34.0 pg   MCHC 31.9 30.0 - 36.0 g/dL   RDW 14.6 11.5 - 15.5 %   Platelets 198 150 - 400 K/uL    Radiology/Results: Ct Abdomen Pelvis W Contrast  Result Date: 05/25/2016 CLINICAL DATA:  Left-sided abdominal and rectal pain EXAM: CT ABDOMEN AND PELVIS WITH CONTRAST TECHNIQUE: Multidetector CT imaging of the abdomen and pelvis was performed using the standard protocol following bolus administration of intravenous contrast. CONTRAST:  12m ISOVUE-300 IOPAMIDOL (ISOVUE-300) INJECTION 61% COMPARISON:  None. FINDINGS: Lower  chest:  No contributory findings. Hepatobiliary: No focal liver abnormality.No evidence of biliary obstruction or stone. Pancreas: Unremarkable. Spleen: Splenomegaly and without focal mass. The spleen measures up to 15 cm, stable from 2016 sonography. No cirrhotic changes in the liver. Adrenals/Urinary Tract: Negative adrenals. No hydronephrosis or stone. Unremarkable bladder. Stomach/Bowel: Gastric bypass without obstruction or marginal inflammation. No appendicitis. Vascular/Lymphatic: No acute vascular abnormality. No mass or adenopathy. Reproductive:No pathologic findings. Other: No ascites or  pneumoperitoneum. Musculoskeletal: No acute or aggressive finding. Disc degeneration with narrowing and endplate spurring greatest at L5-S1. IMPRESSION: 1. No explanation for pain. 2. Splenomegaly that is stable from 2016 sonography Electronically Signed   By: Monte Fantasia M.D.   On: 05/25/2016 14:55    Anti-infectives: Anti-infectives    Start     Dose/Rate Route Frequency Ordered Stop   05/26/16 1000  metroNIDAZOLE (FLAGYL) tablet 500 mg     500 mg Oral Every 8 hours 05/26/16 0801     05/26/16 0900  ciprofloxacin (CIPRO) IVPB 400 mg     400 mg 200 mL/hr over 60 Minutes Intravenous Every 12 hours 05/26/16 0801        Assessment/Plan: Problem List: Patient Active Problem List   Diagnosis Date Noted  . Fever 05/25/2016  . 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 Anxiety and depression 04/02/2015  . OSA (obstructive sleep apnea) 04/02/2015  . Morbid obesity ([place]) 04/02/2015  . PCOS (polycystic ovarian syndrome) 04/02/2015  . Exercise-induced asthma 04/02/2015  . Migraine 04/02/2015    Has done great after bypass.  Blood in stool will likely need colonoscopy since her mother has a history of colon polyps.  A differential with the WBC may be helpful if she has fevers etc .  May be viral syndrome.  I will be away and unable to follow but if surgery is needed please contact [facility] surgeon  on call.   * No surgery found *    LOS: 0 days   Matt B. Hassell Done, MD, [facility] [phone] beeper [phone]  05/26/2016 8:04 AM

## 2016-05-27 ENCOUNTER — Inpatient Hospital Stay (HOSPITAL_COMMUNITY): Payer: 59

## 2016-05-27 ENCOUNTER — Encounter (HOSPITAL_COMMUNITY): Payer: Self-pay | Admitting: *Deleted

## 2016-05-27 DIAGNOSIS — R112 Nausea with vomiting, unspecified: Secondary | ICD-10-CM

## 2016-05-27 DIAGNOSIS — K625 Hemorrhage of anus and rectum: Secondary | ICD-10-CM

## 2016-05-27 LAB — BASIC METABOLIC PANEL
Anion gap: 6 (ref 5–15)
BUN: 7 mg/dL (ref 6–20)
CHLORIDE: 107 mmol/L (ref 101–111)
CO2: 25 mmol/L (ref 22–32)
CREATININE: 0.64 mg/dL (ref 0.44–1.00)
Calcium: 8.6 mg/dL — ABNORMAL LOW (ref 8.9–10.3)
GFR calc Af Amer: >60 mL/min (ref >60)
GFR calc non Af Amer: >60 mL/min (ref >60)
Glucose, Bld: 98 mg/dL (ref 65–99)
POTASSIUM: 3.6 mmol/L (ref 3.5–5.1)
SODIUM: 138 mmol/L (ref 135–145)

## 2016-05-27 LAB — CBC
HEMATOCRIT: 33.2 % — AB (ref 36.0–46.0)
Hemoglobin: 10.5 g/dL — ABNORMAL LOW (ref 12.0–15.0)
MCH: 26.7 pg (ref 26.0–34.0)
MCHC: 31.6 g/dL (ref 30.0–36.0)
MCV: 84.5 fL (ref 78.0–100.0)
PLATELETS: 186 10*3/uL (ref 150–400)
RBC: 3.93 MIL/uL (ref 3.87–5.11)
RDW: 14.7 % (ref 11.5–15.5)
WBC: 4.4 10*3/uL (ref 4.0–10.5)

## 2016-05-27 LAB — FOLATE RBC
FOLATE, HEMOLYSATE: 442.1 ng/mL
Folate, RBC: UNDETERMINED ng/mL

## 2016-05-27 MED ORDER — POTASSIUM CHLORIDE CRYS ER 20 MEQ PO TBCR
40.0000 meq | EXTENDED_RELEASE_TABLET | Freq: Once | ORAL | Status: AC
  Administered 2016-05-27: 40 meq via ORAL
  Filled 2016-05-27: qty 2

## 2016-05-27 MED ORDER — PROMETHAZINE HCL 25 MG/ML IJ SOLN
12.5000 mg | Freq: Four times a day (QID) | INTRAMUSCULAR | Status: DC | PRN
Start: 2016-05-27 — End: 2016-05-31
  Administered 2016-05-29: 25 mg via INTRAVENOUS
  Filled 2016-05-27: qty 1

## 2016-05-27 MED ORDER — FAMOTIDINE IN NACL 20-0.9 MG/50ML-% IV SOLN
20.0000 mg | Freq: Two times a day (BID) | INTRAVENOUS | Status: DC
  Administered 2016-05-27 – 2016-06-01 (×11): 20 mg via INTRAVENOUS
  Filled 2016-05-27 (×12): qty 50

## 2016-05-27 MED ORDER — SODIUM CHLORIDE 0.9 % IV BOLUS (SEPSIS)
1000.0000 mL | Freq: Once | INTRAVENOUS | Status: AC
  Administered 2016-05-27: 1000 mL via INTRAVENOUS

## 2016-05-27 MED ORDER — SODIUM CHLORIDE 0.9 % IV SOLN: INTRAVENOUS | Status: DC

## 2016-05-27 MED ORDER — SODIUM CHLORIDE 0.9 % IV SOLN
8.0000 mg | Freq: Three times a day (TID) | INTRAVENOUS | Status: DC
  Administered 2016-05-27 – 2016-05-31 (×13): 8 mg via INTRAVENOUS
  Filled 2016-05-27 (×17): qty 4

## 2016-05-27 NOTE — Progress Notes (Signed)
PROGRESS NOTE                                                                                                                                                                                                             Patient Demographics:    Kaylee Roberts, is a 35 y.o. female, DOB - 09-20-1980, ZOX:[id number]  Admit date - 05/25/2016   Admitting Physician Tyrone Nineyan B Grunz, MD  Outpatient Primary MD for the patient is Lemont Fillers'SULLIVAN,MELISSA S., NP  LOS - 1  Outpatient Specialists: Gen Surgery Dr Daphine DeutscherMartin.  Chief Complaint  Patient presents with  . Abdominal Pain  . Emesis  . Rectal Bleeding       Brief Narrative   35 y.o. female with medical history significant for gastric bypass presenting for abdominal pain and rectal bleeding, as well febrile 102.1, with mild leukocytosis, CT abdomen and pelvis with no acute findings   Subjective:    Kaylee QuestMarjorie Janusz today has, No headache, No chest pain, Still complains of abdominal pain, and reports of bright red blood per rectum Denies any bowel movement or diarrhea, reports nausea and vomiting, cannot tolerate any by mouth intake..   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 - Hemoglobin remained stable, continue to monitor closely . - GI input greatly appreciated , remains on clear liquid diet, reports she can tolerate , awaiting GI recommendation . - Given likely infectious etiology, continue with Cipro and Flagyl .  Hypokalemia - Repleted  GERD  - continue with PPI  Normocytic anemia - Continue with iron supplement , hemoglobin with slight trend downward secondary to blood loss and delusional  effect from IV fluids     Code Status : Full  Family Communication  : D/W patient  Disposition Plan  : Home  Consults  :  Gen surgery,GI  Procedures  : None  DVT Prophylaxis  : SCDs   Lab Results  Component Value Date   PLT 186 05/27/2016    Antibiotics  :  Anti-infectives    Start     Dose/Rate Route Frequency Ordered Stop   05/26/16 1000  metroNIDAZOLE (FLAGYL) tablet 500 mg  Status:  Discontinued     500 mg Oral Every 8 hours 05/26/16 0801 05/27/16 1303   05/26/16 0900  ciprofloxacin (CIPRO) IVPB 400 mg     400 mg 200 mL/hr over 60 Minutes Intravenous Every 12 hours 05/26/16 0801          Objective:   Vitals:   05/26/16 0604 05/26/16 1445 05/26/16 2127 05/27/16 0528  BP: 113/60 128/67 116/80 111/67  Pulse: 77 67 (!) 56 63  Resp: 16 16 16 18   Temp: 99.3 F (37.4 C) 97.4 F (36.3 C) 98.5 F (36.9 C) 98.2 F (36.8 C)  TempSrc: Oral Oral Oral Oral  SpO2: 95% 96% 99% 100%  Height:    5\' 7"  (1.702 m)    Wt Readings from Last 3 Encounters:  04/12/16 103.7 kg (228 lb 9.6 oz)  12/21/15 119.7 kg (264 lb)  11/09/15 129 kg (284 lb 8 oz)     Intake/Output Summary (Last 24 hours) at 05/27/16 1323 Last data filed at 05/27/16 1020  Gross per 24 hour  Intake             1440 ml  Output                0 ml  Net             1440 ml     Physical Exam  Awake Alert, Oriented X 3, No new F.N deficits, Normal affect Anderson.AT,PERRAL Supple Neck,No JVD, No cervical lymphadenopathy appriciated.  Symmetrical Chest wall movement, Good air movement bilaterally, CTAB RRR,No Gallops,Rubs or new Murmurs, No Parasternal Heave +ve B.Sounds, Abd Soft, Mild tenderness, more significant left abdomen, No rebound - guarding or rigidity. No Cyanosis, Clubbing or edema, No new Rash or bruise      Data Review:    CBC  Recent Labs Lab 05/25/16 1151 05/26/16 0419 05/27/16 0405  WBC 7.1 4.8 4.4  HGB 12.4 10.7* 10.5*  HCT 38.3 33.5* 33.2*  PLT 225 198 186  MCV 82.2 83.3  84.5  MCH 26.6 26.6 26.7  MCHC 32.4 31.9 31.6  RDW 14.2 14.6 14.7    Chemistries   Recent Labs Lab 05/25/16 1151 05/25/16 2000 05/26/16 0419 05/27/16 0405  NA 136  --  137 138  K 3.4*  --  3.3* 3.6  CL 103  --  107 107  CO2 23  --  24 25  GLUCOSE 100*  --  113* 98  BUN 16  --  11 7  CREATININE 0.81  --  0.64 0.64  CALCIUM 9.4  --  8.5* 8.6*  MG  --  1.7  --   --   AST 18  --  21  --   ALT 14  --  19  --   ALKPHOS 71  --  57  --   BILITOT 1.2  --  1.2  --    ------------------------------------------------------------------------------------------------------------------ No results for input(s): CHOL, HDL, LDLCALC, TRIG, CHOLHDL, LDLDIRECT in the last 72 hours.  Lab Results  Component Value Date   HGBA1C 5.1 03/31/2015   ------------------------------------------------------------------------------------------------------------------ No results for input(s): TSH, T4TOTAL, T3FREE, THYROIDAB in the last 72 hours.  Invalid input(s): FREET3 ------------------------------------------------------------------------------------------------------------------  Recent Labs  05/25/16 2000  VITAMINB12 563    Coagulation profile No results for input(s): INR, PROTIME in the last 168 hours.  No results for  input(s): DDIMER in the last 72 hours.  Cardiac Enzymes No results for input(s): CKMB, TROPONINI, MYOGLOBIN in the last 168 hours.  Invalid input(s): CK ------------------------------------------------------------------------------------------------------------------ No results found for: BNP  Inpatient Medications  Scheduled Meds: . ciprofloxacin  400 mg Intravenous Q12H  . famotidine (PEPCID) IV  20 mg Intravenous Q12H  . multivitamin with minerals  1 tablet Oral Daily  . ondansetron (ZOFRAN) IV  8 mg Intravenous Q8H  . sodium chloride  1,000 mL Intravenous Once   Continuous Infusions: . sodium chloride 0.9 % 1,000 mL with potassium chloride 10 mEq infusion 125  mL/hr at 05/27/16 0937   PRN Meds:.albuterol, oxyCODONE-acetaminophen, promethazine  Micro Results Recent Results (from the past 240 hour(s))  Blood culture (routine x 2)     Status: None (Preliminary result)   Collection Time: 05/25/16  1:20 PM  Result Value Ref Range Status   Specimen Description BLOOD LEFT ANTECUBITAL  Final   Special Requests BOTTLES DRAWN AEROBIC AND ANAEROBIC 5CC  Final   Culture   Final    NO GROWTH 2 DAYS [facility]    Report Status PENDING  Incomplete  Blood culture (routine x 2)     Status: None (Preliminary result)   Collection Time: 05/25/16  1:26 PM  Result Value Ref Range Status   Specimen Description BLOOD BLOOD LEFT FOREARM  Final   Special Requests IN PEDIATRIC BOTTLE 4CC  Final   Culture   Final    NO GROWTH 2 DAYS [facility]    Report Status PENDING  Incomplete  Wet prep, genital     Status: Abnormal   Collection Time: 05/25/16  4:50 PM  Result Value Ref Range Status   Yeast Wet Prep HPF POC NONE SEEN NONE SEEN Final   Trich, Wet Prep NONE SEEN NONE SEEN Final   Clue Cells Wet Prep HPF POC NONE SEEN NONE SEEN Final   WBC, Wet Prep HPF POC FEW (A) NONE SEEN Final   Sperm NONE SEEN  Final    Radiology Reports Ct Abdomen Pelvis W Contrast  Result Date: 05/25/2016 CLINICAL DATA:  Left-sided abdominal and rectal pain EXAM: CT ABDOMEN AND PELVIS WITH CONTRAST TECHNIQUE: Multidetector CT imaging of the abdomen and pelvis was performed using the standard protocol following bolus administration of intravenous contrast. CONTRAST:  100mL ISOVUE-300 IOPAMIDOL (ISOVUE-300) INJECTION 61% COMPARISON:  None. FINDINGS: Lower chest:  No contributory findings. Hepatobiliary: No focal liver abnormality.No evidence of biliary obstruction or stone. Pancreas: Unremarkable. Spleen: Splenomegaly and without focal mass. The spleen measures up to 15 cm, stable from 2016 sonography. No cirrhotic changes in the liver.  Adrenals/Urinary Tract: Negative adrenals. No hydronephrosis or stone. Unremarkable bladder. Stomach/Bowel: Gastric bypass without obstruction or marginal inflammation. No appendicitis. Vascular/Lymphatic: No acute vascular abnormality. No mass or adenopathy. Reproductive:No pathologic findings. Other: No ascites or pneumoperitoneum. Musculoskeletal: No acute or aggressive finding. Disc degeneration with narrowing and endplate spurring greatest at L5-S1. IMPRESSION: 1. No explanation for pain. 2. Splenomegaly that is stable from 2016 sonography Electronically Signed   By: Marnee SpringJonathon  Watts M.D.   On: 05/25/2016 14:55     Tamalyn Wadsworth M.D on 05/27/2016 at 1:23 PM  Between 7am to 7pm - Pager - [phone]  After 7pm go to [email or web address] - password [email or web address]  [facility] -  Office  [phone]

## 2016-05-27 NOTE — Progress Notes (Addendum)
Progress Note   Subjective  Feels no better today Vomiting seems to have increased and occurring with any by mouth intake.  Vomiting back all pills. Still having lower and left lower abdominal discomfort but now also more in the epigastrium Very scant blood per rectum occurring with urination. No bowel movement in several days and certainly no diarrhea Zofran not always helping Husband at bedside today No further fever   Objective  Vital signs in last 24 hours: Temp:  [97.4 F (36.3 C)-98.5 F (36.9 C)] 98.2 F (36.8 C) (11/18 0528) Pulse Rate:  [56-67] 63 (11/18 0528) Resp:  [16-18] 18 (11/18 0528) BP: (111-128)/(67-80) 111/67 (11/18 0528) SpO2:  [96 %-100 %] 100 % (11/18 0528) Last BM Date: 05/27/16  Gen: awake, alert, NAD HEENT: anicteric, op clear CV: RRR, no mrg Pulm: CTA b/l Abd: soft, diffuse tenderness without rebound or guarding, ND, +BS throughout Ext: no c/c/e Neuro: nonfocal   Intake/Output from previous day: 11/17 0701 - 11/18 0700 In: 1440 [P.O.:240; I.V.:1000; IV Piggyback:200] Out: -  Intake/Output this shift: Total I/O In: 240 [P.O.:240] Out: -   Lab Results:  Recent Labs  05/25/16 1151 05/26/16 0419 05/27/16 0405  WBC 7.1 4.8 4.4  HGB 12.4 10.7* 10.5*  HCT 38.3 33.5* 33.2*  PLT 225 198 186   BMET  Recent Labs  05/25/16 1151 05/26/16 0419 05/27/16 0405  NA 136 137 138  K 3.4* 3.3* 3.6  CL 103 107 107  CO2 23 24 25   GLUCOSE 100* 113* 98  BUN 16 11 7   CREATININE 0.81 0.64 0.64  CALCIUM 9.4 8.5* 8.6*   LFT  Recent Labs  05/26/16 0419  PROT 6.1*  ALBUMIN 3.7  AST 21  ALT 19  ALKPHOS 57  BILITOT 1.2   PT/INR No results for input(s): LABPROT, INR in the last 72 hours. Hepatitis Panel No results for input(s): HEPBSAG, HCVAB, HEPAIGM, HEPBIGM in the last 72 hours.  Studies/Results: Ct Abdomen Pelvis W Contrast  Result Date: 05/25/2016 CLINICAL DATA:  Left-sided abdominal and rectal pain EXAM: CT ABDOMEN AND  PELVIS WITH CONTRAST TECHNIQUE: Multidetector CT imaging of the abdomen and pelvis was performed using the standard protocol following bolus administration of intravenous contrast. CONTRAST:  100mL ISOVUE-300 IOPAMIDOL (ISOVUE-300) INJECTION 61% COMPARISON:  None. FINDINGS: Lower chest:  No contributory findings. Hepatobiliary: No focal liver abnormality.No evidence of biliary obstruction or stone. Pancreas: Unremarkable. Spleen: Splenomegaly and without focal mass. The spleen measures up to 15 cm, stable from 2016 sonography. No cirrhotic changes in the liver. Adrenals/Urinary Tract: Negative adrenals. No hydronephrosis or stone. Unremarkable bladder. Stomach/Bowel: Gastric bypass without obstruction or marginal inflammation. No appendicitis. Vascular/Lymphatic: No acute vascular abnormality. No mass or adenopathy. Reproductive:No pathologic findings. Other: No ascites or pneumoperitoneum. Musculoskeletal: No acute or aggressive finding. Disc degeneration with narrowing and endplate spurring greatest at L5-S1. IMPRESSION: 1. No explanation for pain. 2. Splenomegaly that is stable from 2016 sonography Electronically Signed   By: Marnee SpringJonathon  Watts M.D.   On: 05/25/2016 14:55      Assessment & Plan  35 year old here with nausea with vomiting, fever on presentation though none since and intermittent blood per rectum without diarrhea  1. Nausea/vomiting/rectal bleeding -- difficult to determine exact etiology of her symptoms. For this reason I recommended upper endoscopy and flexible sigmoidoscopy tomorrow. Infectious enteritis and colitis are felt less likely at this point given lack of diarrhea. EGD to exclude anastomotic ulcers. Flexible sigmoidoscopy to evaluate rectal bleeding --Abdominal ultrasound, rule out gallstones.  Rapid weight loss after gastric bypass makes gallstones more likely --Schedule Zofran 8 mg every 8 hours, IV Phenergan for refractory nausea and vomiting --Discontinue pantoprazole, Flagyl  and iron all of which are oral --IV Pepcid twice a day --Continue IV Cipro --The nature of the procedure, as well as the risks, benefits, and alternatives were carefully and thoroughly reviewed with the patient. Ample time for discussion and questions allowed. The patient understood, was satisfied, and agreed to proceed.  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

## 2016-05-28 ENCOUNTER — Encounter (HOSPITAL_COMMUNITY): Payer: Self-pay

## 2016-05-28 ENCOUNTER — Encounter (HOSPITAL_COMMUNITY)
Admission: EM | Disposition: A | Discharge status: Home / Self Care | Payer: Self-pay | Source: Home / Self Care | Attending: Internal Medicine

## 2016-05-28 DIAGNOSIS — R1010 Upper abdominal pain, unspecified: Secondary | ICD-10-CM

## 2016-05-28 DIAGNOSIS — R1030 Lower abdominal pain, unspecified: Secondary | ICD-10-CM

## 2016-05-28 HISTORY — PX: FLEXIBLE SIGMOIDOSCOPY: SHX5431

## 2016-05-28 HISTORY — PX: ESOPHAGOGASTRODUODENOSCOPY: SHX5428

## 2016-05-28 LAB — COMPREHENSIVE METABOLIC PANEL
ALT: 106 U/L — AB (ref 14–54)
AST: 47 U/L — ABNORMAL HIGH (ref 15–41)
Albumin: 3.1 g/dL — ABNORMAL LOW (ref 3.5–5.0)
Alkaline Phosphatase: 93 U/L (ref 38–126)
Anion gap: 9 (ref 5–15)
BUN: 5 mg/dL — ABNORMAL LOW (ref 6–20)
CHLORIDE: 108 mmol/L (ref 101–111)
CO2: 23 mmol/L (ref 22–32)
CREATININE: 0.77 mg/dL (ref 0.44–1.00)
Calcium: 8.5 mg/dL — ABNORMAL LOW (ref 8.9–10.3)
GFR calc non Af Amer: >60 mL/min (ref >60)
Glucose, Bld: 86 mg/dL (ref 65–99)
Potassium: 3.4 mmol/L — ABNORMAL LOW (ref 3.5–5.1)
SODIUM: 140 mmol/L (ref 135–145)
Total Bilirubin: 0.8 mg/dL (ref 0.3–1.2)
Total Protein: 5.9 g/dL — ABNORMAL LOW (ref 6.5–8.1)

## 2016-05-28 LAB — GASTROINTESTINAL PANEL BY PCR, STOOL (REPLACES STOOL CULTURE)

## 2016-05-28 LAB — CBC
HEMATOCRIT: 32.5 % — AB (ref 36.0–46.0)
HEMOGLOBIN: 10.2 g/dL — AB (ref 12.0–15.0)
MCH: 26.4 pg (ref 26.0–34.0)
MCHC: 31.4 g/dL (ref 30.0–36.0)
MCV: 84 fL (ref 78.0–100.0)
Platelets: 154 10*3/uL (ref 150–400)
RBC: 3.87 MIL/uL (ref 3.87–5.11)
RDW: 14.7 % (ref 11.5–15.5)
WBC: 5.1 10*3/uL (ref 4.0–10.5)

## 2016-05-28 LAB — MONONUCLEOSIS SCREEN: MONO SCREEN: NEGATIVE

## 2016-05-28 LAB — ZINC: Zinc: 60 ug/dL (ref 56–134)

## 2016-05-28 SURGERY — EGD (ESOPHAGOGASTRODUODENOSCOPY)
Anesthesia: Moderate Sedation

## 2016-05-28 MED ORDER — PHENYLEPH-SHARK LIV OIL-MO-PET 0.25-3-14-71.9 % RE OINT
TOPICAL_OINTMENT | Freq: Two times a day (BID) | RECTAL | Status: DC | PRN
  Filled 2016-05-28: qty 28.4

## 2016-05-28 MED ORDER — MIDAZOLAM HCL 5 MG/ML IJ SOLN
INTRAMUSCULAR | Status: AC
  Filled 2016-05-28: qty 2

## 2016-05-28 MED ORDER — FENTANYL CITRATE (PF) 100 MCG/2ML IJ SOLN
INTRAMUSCULAR | Status: DC | PRN
  Administered 2016-05-28 (×3): 25 ug via INTRAVENOUS

## 2016-05-28 MED ORDER — BUTAMBEN-TETRACAINE-BENZOCAINE 2-2-14 % EX AERO
INHALATION_SPRAY | CUTANEOUS | Status: DC | PRN
  Administered 2016-05-28: 2 via TOPICAL

## 2016-05-28 MED ORDER — DIPHENHYDRAMINE HCL 50 MG/ML IJ SOLN
INTRAMUSCULAR | Status: DC | PRN
  Administered 2016-05-28: 50 mg via INTRAVENOUS

## 2016-05-28 MED ORDER — MIDAZOLAM HCL 10 MG/2ML IJ SOLN
INTRAMUSCULAR | Status: DC | PRN
  Administered 2016-05-28 (×2): 2 mg via INTRAVENOUS
  Administered 2016-05-28: 1 mg via INTRAVENOUS
  Administered 2016-05-28: 2 mg via INTRAVENOUS

## 2016-05-28 MED ORDER — FENTANYL CITRATE (PF) 100 MCG/2ML IJ SOLN
INTRAMUSCULAR | Status: AC
  Filled 2016-05-28: qty 2

## 2016-05-28 MED ORDER — DIPHENHYDRAMINE HCL 50 MG/ML IJ SOLN
INTRAMUSCULAR | Status: AC
  Filled 2016-05-28: qty 1

## 2016-05-28 NOTE — Op Note (Signed)
[facility] Patient Name: Kaylee QuestMarjorie Lindbloom Procedure Date: 05/28/2016 MRN: [id number] Attending MD: Beverley FiedlerJay M Manual Navarra , MD Date of Birth: 1981-05-11 CSN: [id number] Age: 35 Admit Type: Inpatient Procedure:                Flexible Sigmoidoscopy Indications:              Rectal hemorrhage, nausea with vomiting, abdominal                            pain Providers:                Carie CaddyJay M. Rhea BeltonPyrtle, MD, Priscella MannAutumn Goldsmith, RN, Beryle BeamsJanie                            [place], Technician Referring MD:             [facility] Medicines:                Midazolam 7 mg IV, Fentanyl 75 micrograms IV,                            Diphenhydramine 50 mg IV Complications:            No immediate complications. Estimated blood loss:                            Minimal. Estimated Blood Loss:     Estimated blood loss: none. Procedure:                Pre-Anesthesia Assessment:                           - Prior to the procedure, a History and Physical                            was performed, and patient medications and                            allergies were reviewed. The patient's tolerance of                            previous anesthesia was also reviewed. The risks                            and benefits of the procedure and the sedation                            options and risks were discussed with the patient.                            All questions were answered, and informed consent                            was obtained. Prior Anticoagulants: The patient has                            taken no  previous anticoagulant or antiplatelet                            agents. ASA Grade Assessment: II - A patient with                            mild systemic disease. After reviewing the risks                            and benefits, the patient was deemed in                            satisfactory condition to undergo the procedure.                           - Prior to the procedure, a  History and Physical                            was performed, and patient medications and                            allergies were reviewed. The patient's tolerance of                            previous anesthesia was also reviewed. The risks                            and benefits of the procedure and the sedation                            options and risks were discussed with the patient.                            All questions were answered, and informed consent                            was obtained. Prior Anticoagulants: The patient has                            taken no previous anticoagulant or antiplatelet                            agents. ASA Grade Assessment: II - A patient with                            mild systemic disease. After reviewing the risks                            and benefits, the patient was deemed in                            satisfactory condition to undergo the procedure.  After obtaining informed consent, the scope was                            passed under direct vision. The [id number])                            scope was introduced through the anus and advanced                            to the the sigmoid colon. The flexible                            sigmoidoscopy was accomplished without difficulty.                            The patient tolerated the procedure well. The                            quality of the bowel preparation was adequate. Scope In: 9:13:19 AM Scope Out: 9:16:45 AM Total Procedure Duration: 0 hours 3 minutes 26 seconds  Findings:      The perianal exam findings include non-thrombosed internal hemorrhoids      The mucosa of the rectum and sigmoid colon appeared normal. Solid light       brown stool was seen in the sigmoid colon without signs of       blood/bleeding.      Internal hemorrhoids were found during retroflexion. The hemorrhoids       were small. Impression:               - The  mucosa in the examined portions of rectum and                            sigmoid colon are normal.                           - Internal hemorrhoids.                           - No specimens collected. Moderate Sedation:      Moderate (conscious) sedation was administered by the endoscopy nurse       and supervised by the endoscopist. The following parameters were       monitored: oxygen saturation, heart rate, blood pressure, and response       to care. Total physician intraservice time was 27 minutes. Recommendation:           - Return patient to hospital ward for ongoing care.                           - Clear liquid diet. Advanced as tolerated.                           - Continue present medications including scheduled                            ondansetron.                           -  If abdominal pain, nausea and vomiting persist,                            would recommend checking HIDA scan given gallstones                            seen on recent US and negative endoscopic                            examination. Procedure Code(s):        --- Professional ---                           [phone], Sigmoidoscopy, flexible; diagnostic,                            including collection of specimen(s) by brushing or                            washing, when performed (separate procedure)                           99152, Moderate sedation services provided by the                            same physician or other qualified health care                            professional performing the diagnostic or                            therapeutic service that the sedation supports,                            requiring the presence of an independent trained                            observer to assist in the monitoring of the                            patient's level of consciousness and physiological                            status; initial 15 minutes of intraservice time,                             patient age 33 years or older                           [id number], Moderate sedation services; each additional                            15 minutes intraservice time Diagnosis Code(s):        --- Professional ---  K64.2, Third degree hemorrhoids                           K62.5, Hemorrhage of anus and rectum CPT copyright 2016 [employer]. All rights reserved. The codes documented in this report are preliminary and upon coder review may  be revised to meet current compliance requirements. Beverley Fiedler, MD 05/28/2016 9:28:05 AM This report has been signed electronically. Number of Addenda: 0

## 2016-05-28 NOTE — Progress Notes (Signed)
See EGD and flexible sigmoidoscopy report -- unremarkable Liver enzymes have bumped. I've ordered acute viral hepatitis panel and autoimmune hepatitis labs. This elevation could be related to viral infection. We'll also check CMV, doubt EBV given lack of sore throat. Pursue HIDA scan given LFTs bump and gallstones seen by ultrasound with upper abdominal pain, persistent nausea and vomiting NPO for HIDA, if it can't be done today, she can be on clear liquids until MN

## 2016-05-28 NOTE — Op Note (Signed)
[facility] Patient Name: Kaylee QuestMarjorie Breunig Procedure Date: 05/28/2016 MRN: [id number] Attending MD: Beverley FiedlerJay M Zayna Toste , MD Date of Birth: Feb 11, 1981 CSN: [id number] Age: [age] Admit Type: Inpatient Procedure:                Upper GI endoscopy Indications:              Epigastric abdominal pain, Lower abdominal pain,                            Nausea with vomiting Providers:                Carie CaddyJay M. Rhea BeltonPyrtle, MD, Priscella MannAutumn Goldsmith, RN, Beryle BeamsJanie                            [place], Technician Referring MD:             [facility] Medicines:                Fentanyl 75 micrograms IV, Midazolam 7 mg IV,                            Diphenhydramine 50 mg IV Complications:            No immediate complications. Estimated Blood Loss:     Estimated blood loss: none. Procedure:                Pre-Anesthesia Assessment:                           - Prior to the procedure, a History and Physical                            was performed, and patient medications and                            allergies were reviewed. The patient's tolerance of                            previous anesthesia was also reviewed. The risks                            and benefits of the procedure and the sedation                            options and risks were discussed with the patient.                            All questions were answered, and informed consent                            was obtained. Prior Anticoagulants: The patient has                            taken no previous anticoagulant or antiplatelet  agents. ASA Grade Assessment: II - A patient with                            mild systemic disease. After reviewing the risks                            and benefits, the patient was deemed in                            satisfactory condition to undergo the procedure.                           - Prior to the procedure, a History and Physical                            was  performed, and patient medications and                            allergies were reviewed. The patient's tolerance of                            previous anesthesia was also reviewed. The risks                            and benefits of the procedure and the sedation                            options and risks were discussed with the patient.                            All questions were answered, and informed consent                            was obtained. Prior Anticoagulants: The patient has                            taken no previous anticoagulant or antiplatelet                            agents. ASA Grade Assessment: II - A patient with                            mild systemic disease. After reviewing the risks                            and benefits, the patient was deemed in                            satisfactory condition to undergo the procedure.                           After obtaining informed consent, the endoscope was  passed under direct vision. Throughout the                            procedure, the patient's blood pressure, pulse, and                            oxygen saturations were monitored continuously. The                            EG-[id number] ([id number]) scope was introduced through the                            mouth, and advanced to the efferent jejunal loop.                            The upper GI endoscopy was accomplished without                            difficulty. The patient tolerated the procedure                            well. Scope In: Scope Out: Findings:      The examined esophagus was normal. Z-line regular at 40cm      Evidence of a Roux-en-Y gastrojejunostomy was found. The gastrojejunal       anastomosis was characterized by healthy appearing mucosa and an intact       staple line. This was traversed. The pouch was characterized by healthy       appearing mucosa. The pouch is 5 cm in length. The duodenum-to-jejunum        limb was not examined as it could not be found.      The examined jejunum was normal in the efferent limb. Impression:               - Normal esophagus.                           - Roux-en-Y gastrojejunostomy with gastrojejunal                            anastomosis characterized by healthy appearing                            mucosa and an intact staple line.                           - Normal examined jejunum.                           - No specimens collected. Moderate Sedation:      Moderate (conscious) sedation was administered by the endoscopy nurse       and supervised by the endoscopist. The following parameters were       monitored: oxygen saturation, heart rate, blood pressure, and response       to care. Total physician intraservice time was 27 minutes. Recommendation:           - Return patient  to hospital ward for ongoing care.                           - Continue present medications.                           - Clear liquid diet.                           - Proceed to flexible sigmoidoscopy. See additional                            procedure report. Procedure Code(s):        --- Professional ---                           [id number], Esophagogastroduodenoscopy, flexible,                            transoral; diagnostic, including collection of                            specimen(s) by brushing or washing, when performed                            (separate procedure) Diagnosis Code(s):        --- Professional ---                           Z98.0, Intestinal bypass and anastomosis status                           R10.13, Epigastric pain                           R10.30, Lower abdominal pain, unspecified                           R11.2, Nausea with vomiting, unspecified CPT copyright 2016 [employer]. All rights reserved. The codes documented in this report are preliminary and upon coder review may  be revised to meet current compliance requirements. Beverley FiedlerJay M  Marily Konczal, MD 05/28/2016 9:21:00 AM This report has been signed electronically. Number of Addenda: 0

## 2016-05-28 NOTE — Progress Notes (Signed)
PROGRESS NOTE                                                                                                                                                                                                             Patient Demographics:    Kaylee Roberts, is a 35 y.o. female, DOB - 1981-01-04, ZOX:[id number]  Admit date - 05/25/2016   Admitting Physician Tyrone Nineyan B Grunz, MD  Outpatient Primary MD for the patient is Lemont Fillers'SULLIVAN,MELISSA S., NP  LOS - 2  Outpatient Specialists: Gen Surgery Dr Daphine DeutscherMartin.  Chief Complaint  Patient presents with  . Abdominal Pain  . Emesis  . Rectal Bleeding       Brief Narrative   35 y.o. female with medical history significant for gastric bypass presenting for abdominal pain and rectal bleeding, as well febrile 102.1, with mild leukocytosis, CT abdomen and pelvis with no acute findings, Ultrasound significant for cholelithiasis, endoscopy and sigmoidoscopy 11/19 were unremarkable.   Subjective:    Kaylee Roberts today has, No headache, No chest pain, Still complains of abdominal pain, reports nausea and vomiting, cannot tolerate any by mouth intake..   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 - Hemoglobin remained stable, continue to monitor closely . - GI input greatly appreciated , EGD and flexible sigmoidoscopy today unremarkable - Ultrasound significant for cholelithiasis, with slight bump in her LFTs,so plan to pursue HIDA scan, as well as: Hepatitis panel and CMV. - Continue with IV Cipro   Hypokalemia - Repleted  GERD  - continue with PPI  Normocytic anemia - Continue with iron supplement ,  hemoglobin with slight trend downward secondary to blood loss and delusional effect from IV fluids     Code Status : Full  Family Communication  : Husband at bedside  Disposition Plan  : Home  Consults  :  Gen surgery,GI  Procedures  : None  DVT Prophylaxis  : SCDs   Lab Results  Component Value Date  PLT 154 05/28/2016    Antibiotics  :    Anti-infectives    Start     Dose/Rate Route Frequency Ordered Stop   05/26/16 1000  metroNIDAZOLE (FLAGYL) tablet 500 mg  Status:  Discontinued     500 mg Oral Every 8 hours 05/26/16 0801 05/27/16 1303   05/26/16 0900  ciprofloxacin (CIPRO) IVPB 400 mg     400 mg 200 mL/hr over 60 Minutes Intravenous Every 12 hours 05/26/16 0801          Objective:   Vitals:   05/28/16 0920 05/28/16 0925 05/28/16 0930 05/28/16 0935  BP: (!) 102/52 118/64 118/75 115/67  Pulse: (!) 54 75 (!) 58 (!) 58  Resp: (!) 22 (!) 22 (!) 21 (!) 25  Temp:      TempSrc:      SpO2: 100% 98% 95% 95%  Weight:      Height:        Wt Readings from Last 3 Encounters:  05/27/16 102.2 kg (225 lb 3.2 oz)  04/12/16 103.7 kg (228 lb 9.6 oz)  12/21/15 119.7 kg (264 lb)     Intake/Output Summary (Last 24 hours) at 05/28/16 1323 Last data filed at 05/28/16 1158  Gross per 24 hour  Intake          3181.88 ml  Output             1200 ml  Net          1981.88 ml     Physical Exam  Awake Alert, Oriented X 3, No new F.N deficits, Normal affect [place].AT,PERRAL Supple Neck,No JVD, No cervical lymphadenopathy appriciated.  Symmetrical Chest wall movement, Good air movement bilaterally, CTAB RRR,No Gallops,Rubs or new Murmurs, No Parasternal Heave +ve B.Sounds, Abd Soft, Mild tenderness, Appears to be shifting gradually from left to mid to right abdomen today , No rebound - guarding or rigidity. No Cyanosis, Clubbing or edema, No new Rash or bruise      Data Review:    CBC  Recent Labs Lab 05/25/16 1151 05/25/16 2000 05/26/16 0419 05/27/16 0405  05/28/16 0404  WBC 7.1  --  4.8 4.4 5.1  HGB 12.4  --  10.7* 10.5* 10.2*  HCT 38.3 TEST REQUEST RECEIVED WITHOUT APPROPRIATE SPECIMEN 33.5* 33.2* 32.5*  PLT 225  --  198 186 154  MCV 82.2  --  83.3 84.5 84.0  MCH 26.6  --  26.6 26.7 26.4  MCHC 32.4  --  31.9 31.6 31.4  RDW 14.2  --  14.6 14.7 14.7    Chemistries   Recent Labs Lab 05/25/16 1151 05/25/16 2000 05/26/16 0419 05/27/16 0405 05/28/16 0404  NA 136  --  137 138 140  K 3.4*  --  3.3* 3.6 3.4*  CL 103  --  107 107 108  CO2 23  --  24 25 23   GLUCOSE 100*  --  113* 98 86  BUN 16  --  11 7 5*  CREATININE 0.81  --  0.64 0.64 0.77  CALCIUM 9.4  --  8.5* 8.6* 8.5*  MG  --  1.7  --   --   --   AST 18  --  21  --  47*  ALT 14  --  19  --  106*  ALKPHOS 71  --  57  --  93  BILITOT 1.2  --  1.2  --  0.8   ------------------------------------------------------------------------------------------------------------------ No results for input(s): CHOL, HDL, LDLCALC, TRIG, CHOLHDL, LDLDIRECT  in the last 72 hours.  Lab Results  Component Value Date   HGBA1C 5.1 03/31/2015   ------------------------------------------------------------------------------------------------------------------ No results for input(s): TSH, T4TOTAL, T3FREE, THYROIDAB in the last 72 hours.  Invalid input(s): FREET3 ------------------------------------------------------------------------------------------------------------------  Recent Labs  05/25/16 2000  VITAMINB12 563    Coagulation profile No results for input(s): INR, PROTIME in the last 168 hours.  No results for input(s): DDIMER in the last 72 hours.  Cardiac Enzymes No results for input(s): CKMB, TROPONINI, MYOGLOBIN in the last 168 hours.  Invalid input(s): CK ------------------------------------------------------------------------------------------------------------------ No results found for: BNP  Inpatient Medications  Scheduled Meds: . ciprofloxacin  400 mg Intravenous  Q12H  . famotidine (PEPCID) IV  20 mg Intravenous Q12H  . multivitamin with minerals  1 tablet Oral Daily  . ondansetron (ZOFRAN) IV  8 mg Intravenous Q8H   Continuous Infusions: . sodium chloride 0.9 % 1,000 mL with potassium chloride 10 mEq infusion 125 mL/hr at 05/28/16 1001   PRN Meds:.albuterol, oxyCODONE-acetaminophen, promethazine  Micro Results Recent Results (from the past 240 hour(s))  Blood culture (routine x 2)     Status: None (Preliminary result)   Collection Time: 05/25/16  1:20 PM  Result Value Ref Range Status   Specimen Description BLOOD LEFT ANTECUBITAL  Final   Special Requests BOTTLES DRAWN AEROBIC AND ANAEROBIC 5CC  Final   Culture   Final    NO GROWTH 2 DAYS [facility]    Report Status PENDING  Incomplete  Blood culture (routine x 2)     Status: None (Preliminary result)   Collection Time: 05/25/16  1:26 PM  Result Value Ref Range Status   Specimen Description BLOOD BLOOD LEFT FOREARM  Final   Special Requests IN PEDIATRIC BOTTLE 4CC  Final   Culture   Final    NO GROWTH 2 DAYS [facility]    Report Status PENDING  Incomplete  Wet prep, genital     Status: Abnormal   Collection Time: 05/25/16  4:50 PM  Result Value Ref Range Status   Yeast Wet Prep HPF POC NONE SEEN NONE SEEN Final   Trich, Wet Prep NONE SEEN NONE SEEN Final   Clue Cells Wet Prep HPF POC NONE SEEN NONE SEEN Final   WBC, Wet Prep HPF POC FEW (A) NONE SEEN Final   Sperm NONE SEEN  Final    Radiology Reports [place] Abdomen Complete  Result Date: 05/27/2016 CLINICAL DATA:  Upper abdominal pain, nausea, vomiting EXAM: ABDOMEN ULTRASOUND COMPLETE COMPARISON:  05/25/2016 FINDINGS: Gallbladder: Multiple small layering gallstones in the gallbladder, the largest 6 mm. No wall thickening. Negative sonographic Murphy's. Common bile duct: Diameter: Normal caliber, 6 mm Liver: No focal lesion identified. Within normal limits in parenchymal echogenicity.  IVC: No abnormality visualized. Pancreas: Visualized portion unremarkable. Spleen: Enlarged with a craniocaudal length of 15.7 cm and a splenic volume of 1114 cubic cm. Right Kidney: Length: 11.4 cm. Echogenicity within normal limits. No mass or hydronephrosis visualized. Left Kidney: Length: 13.9 cm. Echogenicity within normal limits. No mass or hydronephrosis visualized. Abdominal aorta: No aneurysm visualized. Other findings: None. IMPRESSION: Cholelithiasis.  No sonographic evidence of acute cholecystitis. Splenomegaly. Electronically Signed   By: Charlett NoseKevin  Dover M.D.   On: 05/27/2016 15:35   Ct Abdomen Pelvis W Contrast  Result Date: 05/25/2016 CLINICAL DATA:  Left-sided abdominal and rectal pain EXAM: CT ABDOMEN AND PELVIS WITH CONTRAST TECHNIQUE: Multidetector CT imaging of the abdomen and pelvis was performed using the standard protocol  following bolus administration of intravenous contrast. CONTRAST:  ISOVUE-300 IOPAMIDOL (ISOVUE-300) INJECTION 61% COMPARISON:  None. FINDINGS: Lower chest:  No contributory findings. Hepatobiliary: No focal liver abnormality.No evidence of biliary obstruction or stone. Pancreas: Unremarkable. Spleen: Splenomegaly and without focal mass. The spleen measures up to 15 cm, stable from 2016 sonography. No cirrhotic changes in the liver. Adrenals/Urinary Tract: Negative adrenals. No hydronephrosis or stone. Unremarkable bladder. Stomach/Bowel: Gastric bypass without obstruction or marginal inflammation. No appendicitis. Vascular/Lymphatic: No acute vascular abnormality. No mass or adenopathy. Reproductive:No pathologic findings. Other: No ascites or pneumoperitoneum. Musculoskeletal: No acute or aggressive finding. Disc degeneration with narrowing and endplate spurring greatest at L5-S1. IMPRESSION: 1. No explanation for pain. 2. Splenomegaly that is stable from 2016 sonography Electronically Signed   By: Marnee Spring M.D.   On: 05/25/2016 14:55     ELGERGAWY,  DAWOOD M.D on 05/28/2016 at 1:23 PM  Between 7am to 7pm - Pager - [phone]  After 7pm go to [email or web address] - password [facility]  [facility] -  Office  [phone]

## 2016-05-28 NOTE — Progress Notes (Signed)
Tap water enema give x 2 with good result

## 2016-05-29 ENCOUNTER — Encounter (HOSPITAL_COMMUNITY): Payer: Self-pay | Admitting: Internal Medicine

## 2016-05-29 ENCOUNTER — Inpatient Hospital Stay (HOSPITAL_COMMUNITY): Payer: 59

## 2016-05-29 DIAGNOSIS — R112 Nausea with vomiting, unspecified: Secondary | ICD-10-CM

## 2016-05-29 DIAGNOSIS — R1010 Upper abdominal pain, unspecified: Secondary | ICD-10-CM

## 2016-05-29 DIAGNOSIS — R1030 Lower abdominal pain, unspecified: Secondary | ICD-10-CM

## 2016-05-29 LAB — COMPREHENSIVE METABOLIC PANEL
ALBUMIN: 3.2 g/dL — AB (ref 3.5–5.0)
ALK PHOS: 82 U/L (ref 38–126)
ALT: 72 U/L — AB (ref 14–54)
AST: 23 U/L (ref 15–41)
Anion gap: 6 (ref 5–15)
BUN: 5 mg/dL — AB (ref 6–20)
CALCIUM: 8.5 mg/dL — AB (ref 8.9–10.3)
CHLORIDE: 108 mmol/L (ref 101–111)
CO2: 26 mmol/L (ref 22–32)
CREATININE: 0.71 mg/dL (ref 0.44–1.00)
GFR calc non Af Amer: >60 mL/min (ref >60)
GLUCOSE: 89 mg/dL (ref 65–99)
Potassium: 3.7 mmol/L (ref 3.5–5.1)
SODIUM: 140 mmol/L (ref 135–145)
Total Bilirubin: 0.9 mg/dL (ref 0.3–1.2)
Total Protein: 5.7 g/dL — ABNORMAL LOW (ref 6.5–8.1)

## 2016-05-29 LAB — CBC
HCT: 32.5 % — ABNORMAL LOW (ref 36.0–46.0)
HEMOGLOBIN: 10.3 g/dL — AB (ref 12.0–15.0)
MCH: 26.5 pg (ref 26.0–34.0)
MCHC: 31.7 g/dL (ref 30.0–36.0)
MCV: 83.8 fL (ref 78.0–100.0)
Platelets: 171 10*3/uL (ref 150–400)
RBC: 3.88 MIL/uL (ref 3.87–5.11)
RDW: 14.6 % (ref 11.5–15.5)
WBC: 5.9 10*3/uL (ref 4.0–10.5)

## 2016-05-29 LAB — CMV IGM

## 2016-05-29 LAB — HEPATITIS PANEL, ACUTE
HEP A IGM: NEGATIVE
HEP B S AG: NEGATIVE
Hep B C IgM: NEGATIVE

## 2016-05-29 LAB — EPSTEIN-BARR VIRUS VCA, IGM: EBV VCA IgM: <36 U/mL (ref 0.0–35.9)

## 2016-05-29 LAB — IGG: IgG (Immunoglobin G), Serum: 837 mg/dL (ref 700–1600)

## 2016-05-29 MED ORDER — TECHNETIUM TC 99M MEBROFENIN IV KIT
5.2800 | PACK | Freq: Once | INTRAVENOUS | Status: AC | PRN
  Administered 2016-05-29: 5.28 via INTRAVENOUS

## 2016-05-29 NOTE — Progress Notes (Signed)
PROGRESS NOTE                                                                                                                                                                                                             Patient Demographics:    Kaylee Roberts, is a 35 y.o. female, DOB - 1980-12-17, BJY:[id number]  Admit date - 05/25/2016   Admitting Physician Tyrone Nine, MD  Outpatient Primary MD for the patient is Lemont Fillers., NP  LOS - 3  Outpatient Specialists: Gen Surgery Dr Daphine Deutscher.  Chief Complaint  Patient presents with  . Abdominal Pain  . Emesis  . Rectal Bleeding       Brief Narrative   35 y.o. female with medical history significant for gastric bypass presenting for abdominal pain and rectal bleeding, as well febrile 102.1, with mild leukocytosis, CT abdomen and pelvis with no acute findings, Ultrasound significant for cholelithiasis, endoscopy and sigmoidoscopy 11/19 were unremarkable.   Subjective:    Kaylee Roberts today has, No headache, No chest pain,  abdominal pain improving, reports nausea and vomiting, Nothing by mouth this a.m. 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 - Hemoglobin remained stable,  - GI input greatly appreciated , EGD and flexible sigmoidoscopy 11/19 unremarkable - Ultrasound significant for cholelithiasis, with slight bump in her LFTs,so plan to pursue HIDA scan, - Continue with IV Cipro - Possible viral etiology, await hepatitis panel results, Monospot negative.  Hypokalemia - Repleted  GERD  - continue with PPI  Normocytic anemia - Continue with iron  supplement , hemoglobin with slight trend downward secondary to blood loss and delusional effect from IV fluids , remained stable    Code Status : Full  Family Communication  : Husband at bedside  Disposition Plan  : Home  Consults  :  Gen surgery,GI  Procedures  : None  DVT Prophylaxis  : SCDs   Lab Results  Component Value Date  PLT 171 05/29/2016    Antibiotics  :    Anti-infectives    Start     Dose/Rate Route Frequency Ordered Stop   05/26/16 1000  metroNIDAZOLE (FLAGYL) tablet 500 mg  Status:  Discontinued     500 mg Oral Every 8 hours 05/26/16 0801 05/27/16 1303   05/26/16 0900  ciprofloxacin (CIPRO) IVPB 400 mg     400 mg 200 mL/hr over 60 Minutes Intravenous Every 12 hours 05/26/16 0801          Objective:   Vitals:   05/28/16 0930 05/28/16 0935 05/28/16 2100 05/29/16 0501  BP: 118/75 115/67 117/69 125/83  Pulse: (!) 58 (!) 58 (!) 48 (!) 46  Resp: (!) 21 (!) 25 20 18   Temp:   97.7 F (36.5 C) 97.2 F (36.2 C)  TempSrc:   Oral Oral  SpO2: 95% 95% 96% 100%  Weight:      Height:        Wt Readings from Last 3 Encounters:  05/27/16 102.2 kg (225 lb 3.2 oz)  04/12/16 103.7 kg (228 lb 9.6 oz)  12/21/15 119.7 kg (264 lb)     Intake/Output Summary (Last 24 hours) at 05/29/16 1146 Last data filed at 05/29/16 0445  Gross per 24 hour  Intake          4722.65 ml  Output             1200 ml  Net          3522.65 ml     Physical Exam  Awake Alert, Oriented X 3, No new F.N deficits, Normal affect [place].AT,PERRAL Supple Neck,No JVD, No cervical lymphadenopathy appriciated.  Symmetrical Chest wall movement, Good air movement bilaterally, CTAB RRR,No Gallops,Rubs or new Murmurs, No Parasternal Heave +ve B.Sounds, Abd Soft, Mild Abdomen tenderness , No rebound - guarding or rigidity. No Cyanosis, Clubbing or edema, No new Rash or bruise      Data Review:    CBC  Recent Labs Lab 05/25/16 1151 05/25/16 2000 05/26/16 0419 05/27/16 0405  05/28/16 0404 05/29/16 0338  WBC 7.1  --  4.8 4.4 5.1 5.9  HGB 12.4  --  10.7* 10.5* 10.2* 10.3*  HCT 38.3 TEST REQUEST RECEIVED WITHOUT APPROPRIATE SPECIMEN 33.5* 33.2* 32.5* 32.5*  PLT 225  --  198 186 154 171  MCV 82.2  --  83.3 84.5 84.0 83.8  MCH 26.6  --  26.6 26.7 26.4 26.5  MCHC 32.4  --  31.9 31.6 31.4 31.7  RDW 14.2  --  14.6 14.7 14.7 14.6    Chemistries   Recent Labs Lab 05/25/16 1151 05/25/16 2000 05/26/16 0419 05/27/16 0405 05/28/16 0404 05/29/16 0338  NA 136  --  137 138 140 140  K 3.4*  --  3.3* 3.6 3.4* 3.7  CL 103  --  107 107 108 108  CO2 23  --  24 25 23 26   GLUCOSE 100*  --  113* 98 86 89  BUN 16  --  11 7 5* 5*  CREATININE 0.81  --  0.64 0.64 0.77 0.71  CALCIUM 9.4  --  8.5* 8.6* 8.5* 8.5*  MG  --  1.7  --   --   --   --   AST 18  --  21  --  47* 23  ALT 14  --  19  --  106* 72*  ALKPHOS 71  --  57  --  93 82  BILITOT 1.2  --  1.2  --  0.8 0.9   ------------------------------------------------------------------------------------------------------------------ No results for input(s): CHOL, HDL, LDLCALC, TRIG, CHOLHDL, LDLDIRECT in the last 72 hours.  Lab Results  Component Value Date   HGBA1C 5.1 03/31/2015   ------------------------------------------------------------------------------------------------------------------ No results for input(s): TSH, T4TOTAL, T3FREE, THYROIDAB in the last 72 hours.  Invalid input(s): FREET3 ------------------------------------------------------------------------------------------------------------------ No results for input(s): VITAMINB12, FOLATE, FERRITIN, TIBC, IRON, RETICCTPCT in the last 72 hours.  Coagulation profile No results for input(s): INR, PROTIME in the last 168 hours.  No results for input(s): DDIMER in the last 72 hours.  Cardiac Enzymes No results for input(s): CKMB, TROPONINI, MYOGLOBIN in the last 168 hours.  Invalid input(s):  CK ------------------------------------------------------------------------------------------------------------------ No results found for: BNP  Inpatient Medications  Scheduled Meds: . ciprofloxacin  400 mg Intravenous Q12H  . famotidine (PEPCID) IV  20 mg Intravenous Q12H  . multivitamin with minerals  1 tablet Oral Daily  . ondansetron (ZOFRAN) IV  8 mg Intravenous Q8H   Continuous Infusions: . sodium chloride 0.9 % 1,000 mL with potassium chloride 10 mEq infusion 125 mL/hr at 05/29/16 0443   PRN Meds:.albuterol, oxyCODONE-acetaminophen, phenylephrine-shark liver oil-mineral oil-petrolatum, promethazine  Micro Results Recent Results (from the past 240 hour(s))  Blood culture (routine x 2)     Status: None (Preliminary result)   Collection Time: 05/25/16  1:20 PM  Result Value Ref Range Status   Specimen Description BLOOD LEFT ANTECUBITAL  Final   Special Requests BOTTLES DRAWN AEROBIC AND ANAEROBIC 5CC  Final   Culture   Final    NO GROWTH 3 DAYS Performed at [facility]    Report Status PENDING  Incomplete  Blood culture (routine x 2)     Status: None (Preliminary result)   Collection Time: 05/25/16  1:26 PM  Result Value Ref Range Status   Specimen Description BLOOD BLOOD LEFT FOREARM  Final   Special Requests IN PEDIATRIC BOTTLE 4CC  Final   Culture   Final    NO GROWTH 3 DAYS Performed at [facility]    Report Status PENDING  Incomplete  Wet prep, genital     Status: Abnormal   Collection Time: 05/25/16  4:50 PM  Result Value Ref Range Status   Yeast Wet Prep HPF POC NONE SEEN NONE SEEN Final   Trich, Wet Prep NONE SEEN NONE SEEN Final   Clue Cells Wet Prep HPF POC NONE SEEN NONE SEEN Final   WBC, Wet Prep HPF POC FEW (A) NONE SEEN Final   Sperm NONE SEEN  Final  Gastrointestinal Panel by PCR , Stool     Status: None   Collection Time: 05/26/16 10:32 AM  Result Value Ref Range Status   Campylobacter species NOT DETECTED NOT DETECTED Final    Plesimonas shigelloides NOT DETECTED NOT DETECTED Final   Salmonella species NOT DETECTED NOT DETECTED Final   Yersinia enterocolitica NOT DETECTED NOT DETECTED Final   Vibrio species NOT DETECTED NOT DETECTED Final   Vibrio cholerae NOT DETECTED NOT DETECTED Final   Enteroaggregative E coli (EAEC) NOT DETECTED NOT DETECTED Final   Enteropathogenic E coli (EPEC) NOT DETECTED NOT DETECTED Final   Enterotoxigenic E coli (ETEC) NOT DETECTED NOT DETECTED Final   Shiga like toxin producing E coli (STEC) NOT DETECTED NOT DETECTED Final   Shigella/Enteroinvasive E coli (EIEC) NOT DETECTED NOT DETECTED Final   Cryptosporidium NOT DETECTED NOT DETECTED Final   Cyclospora cayetanensis NOT DETECTED NOT DETECTED Final   Entamoeba histolytica NOT DETECTED NOT DETECTED Final  Giardia lamblia NOT DETECTED NOT DETECTED Final   Adenovirus F40/41 NOT DETECTED NOT DETECTED Final   Astrovirus NOT DETECTED NOT DETECTED Final   Norovirus GI/GII NOT DETECTED NOT DETECTED Final   Rotavirus A NOT DETECTED NOT DETECTED Final   Sapovirus (I, II, IV, and V) NOT DETECTED NOT DETECTED Final    Radiology Reports US Abdomen Complete  Result Date: 05/27/2016 CLINICAL DATA:  Upper abdominal pain, nausea, vomiting EXAM: ABDOMEN ULTRASOUND COMPLETE COMPARISON:  05/25/2016 FINDINGS: Gallbladder: Multiple small layering gallstones in the gallbladder, the largest 6 mm. No wall thickening. Negative sonographic Murphy's. Common bile duct: Diameter: Normal caliber, 6 mm Liver: No focal lesion identified. Within normal limits in parenchymal echogenicity. IVC: No abnormality visualized. Pancreas: Visualized portion unremarkable. Spleen: Enlarged with a craniocaudal length of 15.7 cm and a splenic volume of 1114 cubic cm. Right Kidney: Length: 11.4 cm. Echogenicity within normal limits. No mass or hydronephrosis visualized. Left Kidney: Length: 13.9 cm. Echogenicity within normal limits. No mass or hydronephrosis visualized. Abdominal  aorta: No aneurysm visualized. Other findings: None. IMPRESSION: Cholelithiasis.  No sonographic evidence of acute cholecystitis. Splenomegaly. Electronically Signed   By: Charlett Nose M.D.   On: 05/27/2016 15:35   Ct Abdomen Pelvis W Contrast  Result Date: 05/25/2016 CLINICAL DATA:  Left-sided abdominal and rectal pain EXAM: CT ABDOMEN AND PELVIS WITH CONTRAST TECHNIQUE: Multidetector CT imaging of the abdomen and pelvis was performed using the standard protocol following bolus administration of intravenous contrast. CONTRAST:  ISOVUE-300 IOPAMIDOL (ISOVUE-300) INJECTION 61% COMPARISON:  None. FINDINGS: Lower chest:  No contributory findings. Hepatobiliary: No focal liver abnormality.No evidence of biliary obstruction or stone. Pancreas: Unremarkable. Spleen: Splenomegaly and without focal mass. The spleen measures up to 15 cm, stable from 2016 sonography. No cirrhotic changes in the liver. Adrenals/Urinary Tract: Negative adrenals. No hydronephrosis or stone. Unremarkable bladder. Stomach/Bowel: Gastric bypass without obstruction or marginal inflammation. No appendicitis. Vascular/Lymphatic: No acute vascular abnormality. No mass or adenopathy. Reproductive:No pathologic findings. Other: No ascites or pneumoperitoneum. Musculoskeletal: No acute or aggressive finding. Disc degeneration with narrowing and endplate spurring greatest at L5-S1. IMPRESSION: 1. No explanation for pain. 2. Splenomegaly that is stable from 2016 sonography Electronically Signed   By: Marnee Spring M.D.   On: 05/25/2016 14:55     Aella Ronda M.D on 05/29/2016 at 11:46 AM  Between 7am to 7pm - Pager - [phone]  After 7pm go to [email or web address] - password [email or web address]  [facility] -  Office  [phone]                                          PROGRESS NOTE  Patient Demographics:    Kaylee Roberts, is a 35 y.o. female, DOB - Apr 23, 1981, WUJ:[id number]  Admit date - 05/25/2016   Admitting Physician Tyrone Nineyan B Grunz, MD  Outpatient Primary MD for the patient is Lemont Fillers'SULLIVAN,MELISSA S., NP  LOS - 3  Outpatient Specialists: Gen Surgery Dr Daphine DeutscherMartin.  Chief Complaint  Patient presents with  . Abdominal Pain  . Emesis  . Rectal Bleeding       Brief Narrative   35 y.o. female with medical history significant for gastric bypass presenting for abdominal pain and rectal bleeding, as well febrile 102.1, with mild leukocytosis, CT abdomen and pelvis with no acute findings, Ultrasound significant for cholelithiasis, endoscopy and sigmoidoscopy 11/19 were unremarkable.   Subjective:    Kaylee Roberts today has, No headache, No chest pain, Still complains of abdominal pain, reports nausea and vomiting, cannot tolerate any by mouth intake..   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 - Hemoglobin remained stable, continue to monitor closely . - GI input greatly appreciated , EGD and flexible sigmoidoscopy today unremarkable - Ultrasound significant for cholelithiasis, with slight bump in her LFTs,so plan to pursue HIDA scan, as well as: Hepatitis panel and CMV. - Continue with IV Cipro   Hypokalemia - Repleted  GERD  - continue with PPI  Normocytic anemia - Continue with iron supplement , hemoglobin with slight trend downward secondary to blood loss and delusional effect from IV fluids     Code Status : Full  Family Communication  : Husband at bedside  Disposition Plan  :  Home  Consults  :  Gen surgery,GI  Procedures  : None  DVT Prophylaxis  : SCDs   Lab Results  Component Value Date   PLT 171 05/29/2016    Antibiotics  :    Anti-infectives    Start     Dose/Rate Route Frequency Ordered Stop   05/26/16 1000  metroNIDAZOLE (FLAGYL) tablet 500 mg  Status:  Discontinued     500 mg Oral Every 8 hours 05/26/16 0801 05/27/16 1303   05/26/16 0900  ciprofloxacin (CIPRO) IVPB 400 mg     400 mg 200 mL/hr over 60 Minutes Intravenous Every 12 hours 05/26/16 0801          Objective:   Vitals:   05/28/16 0930 05/28/16 0935 05/28/16 2100 05/29/16 0501  BP: 118/75 115/67 117/69 125/83  Pulse: (!) 58 (!) 58 (!) 48 (!) 46  Resp: (!) 21 (!) 25 20 18   Temp:   97.7 F (36.5 C) 97.2 F (36.2 C)  TempSrc:   Oral Oral  SpO2: 95% 95% 96% 100%  Weight:      Height:        Wt Readings from Last 3 Encounters:  05/27/16 102.2 kg (225 lb 3.2 oz)  04/12/16 103.7 kg (228 lb 9.6 oz)  12/21/15 119.7 kg (264 lb)     Intake/Output Summary (Last 24 hours) at 05/29/16 1146 Last data filed at 05/29/16 0445  Gross per 24 hour  Intake          4722.65 ml  Output  1200 ml  Net          3522.65 ml     Physical Exam  Awake Alert, Oriented X 3, No new F.N deficits, Normal affect [place].AT,PERRAL Supple Neck,No JVD, No cervical lymphadenopathy appriciated.  Symmetrical Chest wall movement, Good air movement bilaterally, CTAB RRR,No Gallops,Rubs or new Murmurs, No Parasternal Heave +ve B.Sounds, Abd Soft, Mild tenderness, Appears to be shifting gradually from left to mid to right abdomen today , No rebound - guarding or rigidity. No Cyanosis, Clubbing or edema, No new Rash or bruise      Data Review:    CBC  Recent Labs Lab 05/25/16 1151 05/25/16 2000 05/26/16 0419 05/27/16 0405 05/28/16 0404 05/29/16 0338  WBC 7.1  --  4.8 4.4 5.1 5.9  HGB 12.4  --  10.7* 10.5* 10.2* 10.3*  HCT 38.3 TEST REQUEST RECEIVED WITHOUT APPROPRIATE SPECIMEN 33.5*  33.2* 32.5* 32.5*  PLT 225  --  198 186 154 171  MCV 82.2  --  83.3 84.5 84.0 83.8  MCH 26.6  --  26.6 26.7 26.4 26.5  MCHC 32.4  --  31.9 31.6 31.4 31.7  RDW 14.2  --  14.6 14.7 14.7 14.6    Chemistries   Recent Labs Lab 05/25/16 1151 05/25/16 2000 05/26/16 0419 05/27/16 0405 05/28/16 0404 05/29/16 0338  NA 136  --  137 138 140 140  K 3.4*  --  3.3* 3.6 3.4* 3.7  CL 103  --  107 107 108 108  CO2 23  --  24 25 23 26   GLUCOSE 100*  --  113* 98 86 89  BUN 16  --  11 7 5* 5*  CREATININE 0.81  --  0.64 0.64 0.77 0.71  CALCIUM 9.4  --  8.5* 8.6* 8.5* 8.5*  MG  --  1.7  --   --   --   --   AST 18  --  21  --  47* 23  ALT 14  --  19  --  106* 72*  ALKPHOS 71  --  57  --  93 82  BILITOT 1.2  --  1.2  --  0.8 0.9   ------------------------------------------------------------------------------------------------------------------ No results for input(s): CHOL, HDL, LDLCALC, TRIG, CHOLHDL, LDLDIRECT in the last 72 hours.  Lab Results  Component Value Date   HGBA1C 5.1 03/31/2015   ------------------------------------------------------------------------------------------------------------------ No results for input(s): TSH, T4TOTAL, T3FREE, THYROIDAB in the last 72 hours.  Invalid input(s): FREET3 ------------------------------------------------------------------------------------------------------------------ No results for input(s): VITAMINB12, FOLATE, FERRITIN, TIBC, IRON, RETICCTPCT in the last 72 hours.  Coagulation profile No results for input(s): INR, PROTIME in the last 168 hours.  No results for input(s): DDIMER in the last 72 hours.  Cardiac Enzymes No results for input(s): CKMB, TROPONINI, MYOGLOBIN in the last 168 hours.  Invalid input(s): CK ------------------------------------------------------------------------------------------------------------------ No results found for: BNP  Inpatient Medications  Scheduled Meds: . ciprofloxacin  400 mg Intravenous  Q12H  . famotidine (PEPCID) IV  20 mg Intravenous Q12H  . multivitamin with minerals  1 tablet Oral Daily  . ondansetron (ZOFRAN) IV  8 mg Intravenous Q8H   Continuous Infusions: . sodium chloride 0.9 % 1,000 mL with potassium chloride 10 mEq infusion 125 mL/hr at 05/29/16 0443   PRN Meds:.albuterol, oxyCODONE-acetaminophen, phenylephrine-shark liver oil-mineral oil-petrolatum, promethazine  Micro Results Recent Results (from the past 240 hour(s))  Blood culture (routine x 2)     Status: None (Preliminary result)   Collection Time: 05/25/16  1:20 PM  Result Value  Ref Range Status   Specimen Description BLOOD LEFT ANTECUBITAL  Final   Special Requests BOTTLES DRAWN AEROBIC AND ANAEROBIC 5CC  Final   Culture   Final    NO GROWTH 3 DAYS Performed at [facility]    Report Status PENDING  Incomplete  Blood culture (routine x 2)     Status: None (Preliminary result)   Collection Time: 05/25/16  1:26 PM  Result Value Ref Range Status   Specimen Description BLOOD BLOOD LEFT FOREARM  Final   Special Requests IN PEDIATRIC BOTTLE 4CC  Final   Culture   Final    NO GROWTH 3 DAYS Performed at [facility]    Report Status PENDING  Incomplete  Wet prep, genital     Status: Abnormal   Collection Time: 05/25/16  4:50 PM  Result Value Ref Range Status   Yeast Wet Prep HPF POC NONE SEEN NONE SEEN Final   Trich, Wet Prep NONE SEEN NONE SEEN Final   Clue Cells Wet Prep HPF POC NONE SEEN NONE SEEN Final   WBC, Wet Prep HPF POC FEW (A) NONE SEEN Final   Sperm NONE SEEN  Final  Gastrointestinal Panel by PCR , Stool     Status: None   Collection Time: 05/26/16 10:32 AM  Result Value Ref Range Status   Campylobacter species NOT DETECTED NOT DETECTED Final   Plesimonas shigelloides NOT DETECTED NOT DETECTED Final   Salmonella species NOT DETECTED NOT DETECTED Final   Yersinia enterocolitica NOT DETECTED NOT DETECTED Final   Vibrio species NOT DETECTED NOT DETECTED Final   Vibrio  cholerae NOT DETECTED NOT DETECTED Final   Enteroaggregative E coli (EAEC) NOT DETECTED NOT DETECTED Final   Enteropathogenic E coli (EPEC) NOT DETECTED NOT DETECTED Final   Enterotoxigenic E coli (ETEC) NOT DETECTED NOT DETECTED Final   Shiga like toxin producing E coli (STEC) NOT DETECTED NOT DETECTED Final   Shigella/Enteroinvasive E coli (EIEC) NOT DETECTED NOT DETECTED Final   Cryptosporidium NOT DETECTED NOT DETECTED Final   Cyclospora cayetanensis NOT DETECTED NOT DETECTED Final   Entamoeba histolytica NOT DETECTED NOT DETECTED Final   Giardia lamblia NOT DETECTED NOT DETECTED Final   Adenovirus F40/41 NOT DETECTED NOT DETECTED Final   Astrovirus NOT DETECTED NOT DETECTED Final   Norovirus GI/GII NOT DETECTED NOT DETECTED Final   Rotavirus A NOT DETECTED NOT DETECTED Final   Sapovirus (I, II, IV, and V) NOT DETECTED NOT DETECTED Final    Radiology Reports US Abdomen Complete  Result Date: 05/27/2016 CLINICAL DATA:  Upper abdominal pain, nausea, vomiting EXAM: ABDOMEN ULTRASOUND COMPLETE COMPARISON:  05/25/2016 FINDINGS: Gallbladder: Multiple small layering gallstones in the gallbladder, the largest 6 mm. No wall thickening. Negative sonographic Murphy's. Common bile duct: Diameter: Normal caliber, 6 mm Liver: No focal lesion identified. Within normal limits in parenchymal echogenicity. IVC: No abnormality visualized. Pancreas: Visualized portion unremarkable. Spleen: Enlarged with a craniocaudal length of 15.7 cm and a splenic volume of 1114 cubic cm. Right Kidney: Length: 11.4 cm. Echogenicity within normal limits. No mass or hydronephrosis visualized. Left Kidney: Length: 13.9 cm. Echogenicity within normal limits. No mass or hydronephrosis visualized. Abdominal aorta: No aneurysm visualized. Other findings: None. IMPRESSION: Cholelithiasis.  No sonographic evidence of acute cholecystitis. Splenomegaly. Electronically Signed   By: Charlett Nose M.D.   On: 05/27/2016 15:35   Ct Abdomen  Pelvis W Contrast  Result Date: 05/25/2016 CLINICAL DATA:  Left-sided abdominal and rectal pain EXAM: CT ABDOMEN AND PELVIS WITH  CONTRAST TECHNIQUE: Multidetector CT imaging of the abdomen and pelvis was performed using the standard protocol following bolus administration of intravenous contrast. CONTRAST:  100mL ISOVUE-300 IOPAMIDOL (ISOVUE-300) INJECTION 61% COMPARISON:  None. FINDINGS: Lower chest:  No contributory findings. Hepatobiliary: No focal liver abnormality.No evidence of biliary obstruction or stone. Pancreas: Unremarkable. Spleen: Splenomegaly and without focal mass. The spleen measures up to 15 cm, stable from 2016 sonography. No cirrhotic changes in the liver. Adrenals/Urinary Tract: Negative adrenals. No hydronephrosis or stone. Unremarkable bladder. Stomach/Bowel: Gastric bypass without obstruction or marginal inflammation. No appendicitis. Vascular/Lymphatic: No acute vascular abnormality. No mass or adenopathy. Reproductive:No pathologic findings. Other: No ascites or pneumoperitoneum. Musculoskeletal: No acute or aggressive finding. Disc degeneration with narrowing and endplate spurring greatest at L5-S1. IMPRESSION: 1. No explanation for pain. 2. Splenomegaly that is stable from 2016 sonography Electronically Signed   By: Marnee SpringJonathon  Watts M.D.   On: 05/25/2016 14:55     Katy Brickell M.D on 05/29/2016 at 11:46 AM  Between 7am to 7pm - Pager - [phone]  After 7pm go to [email or web address] - password [email or web address]  [facility] -  Office  [phone]

## 2016-05-29 NOTE — Progress Notes (Signed)
Progress Note   Subjective  Patient reports no fevers overnight. She is awaiting HIDA scan today. States pain in the epigastric area, rated 5/10. Overall pain has slowly improved from admission. She has not tried anything PO yet.    Objective   Vital signs in last 24 hours: Temp:  [97.2 F (36.2 C)-98.2 F (36.8 C)] 97.2 F (36.2 C) (11/20 0501) Pulse Rate:  [46-98] 46 (11/20 0501) Resp:  [18-25] 18 (11/20 0501) BP: (102-145)/(52-88) 125/83 (11/20 0501) SpO2:  [95 %-100 %] 100 % (11/20 0501) Last BM Date: 05/28/16 General:    white female in NAD Heart:  Regular rate and rhythm; no murmurs Lungs: Respirations even and unlabored, lungs CTA bilaterally Abdomen:  Soft, mild epigastric to RUQ TTP, and nondistended. Normal bowel sounds. Extremities:  Without edema. Neurologic:  Alert and oriented,  grossly normal neurologically. Psych:  Cooperative. Normal mood and affect.  Intake/Output from previous day: 11/19 0701 - 11/20 0700 In: 4722.7 [P.O.:460; I.V.:3304.7; IV Piggyback:958] Out: 1200 [Urine:1200] Intake/Output this shift: No intake/output data recorded.  Lab Results:  Recent Labs  05/27/16 0405 05/28/16 0404 05/29/16 0338  WBC 4.4 5.1 5.9  HGB 10.5* 10.2* 10.3*  HCT 33.2* 32.5* 32.5*  PLT 186 154 171   BMET  Recent Labs  05/27/16 0405 05/28/16 0404 05/29/16 0338  NA 138 140 140  K 3.6 3.4* 3.7  CL 107 108 108  CO2 25 23 26   GLUCOSE 98 86 89  BUN 7 5* 5*  CREATININE 0.64 0.77 0.71  CALCIUM 8.6* 8.5* 8.5*   LFT  Recent Labs  05/29/16 0338  PROT 5.7*  ALBUMIN 3.2*  AST 23  ALT 72*  ALKPHOS 82  BILITOT 0.9   PT/INR No results for input(s): LABPROT, INR in the last 72 hours.  Studies/Results: [place] Abdomen Complete  Result Date: 05/27/2016 CLINICAL DATA:  Upper abdominal pain, nausea, vomiting EXAM: ABDOMEN ULTRASOUND COMPLETE COMPARISON:  05/25/2016 FINDINGS: Gallbladder: Multiple small layering gallstones in the gallbladder, the  largest 6 mm. No wall thickening. Negative sonographic Murphy's. Common bile duct: Diameter: Normal caliber, 6 mm Liver: No focal lesion identified. Within normal limits in parenchymal echogenicity. IVC: No abnormality visualized. Pancreas: Visualized portion unremarkable. Spleen: Enlarged with a craniocaudal length of 15.7 cm and a splenic volume of 1114 cubic cm. Right Kidney: Length: 11.4 cm. Echogenicity within normal limits. No mass or hydronephrosis visualized. Left Kidney: Length: 13.9 cm. Echogenicity within normal limits. No mass or hydronephrosis visualized. Abdominal aorta: No aneurysm visualized. Other findings: None. IMPRESSION: Cholelithiasis.  No sonographic evidence of acute cholecystitis. Splenomegaly. Electronically Signed   By: Charlett NoseKevin  Dover M.D.   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 Workup to date has included CT scan, US, EGD, and flex sig. Splenomegaly noted which appears stable / chronic, otherwise gallstones noted in GB without cholecystitis. Endoscopies unremarkable with exception of hemorrhoids which may the source of her scant rectal bleeding. Mild ALT elevation noted which has since downtrended. Overall she is better from admission but does continue to have some abdominal tenderness.   HIDA scan pending today although symptoms not classic for biliary colic. Serologies pending for acute viral hepatitis / AIH regarding elevated ALT. Monospot negative. It's possible with her fevers and acute symptoms this could be a viral  infection, perhaps some of her pain at this point is musculoskeletal after multiple episodes of vomiting.   We will await HIDA today, if negative, will advance diet and see how she tolerates it. Labs stable. If tolerating diet and pain controlled anticipated discharge later today or  tomorrow.   Ileene PatrickSteven Armbruster, MD [facility] Pager [phone]

## 2016-05-29 NOTE — Telephone Encounter (Signed)
Called pt. LVM for pt to call our office to schedule a hospital F/U.

## 2016-05-30 DIAGNOSIS — R112 Nausea with vomiting, unspecified: Secondary | ICD-10-CM

## 2016-05-30 LAB — COMPREHENSIVE METABOLIC PANEL
ALBUMIN: 3 g/dL — AB (ref 3.5–5.0)
ALK PHOS: 73 U/L (ref 38–126)
ALT: 56 U/L — AB (ref 14–54)
AST: 21 U/L (ref 15–41)
Anion gap: 5 (ref 5–15)
BILIRUBIN TOTAL: 1 mg/dL (ref 0.3–1.2)
CALCIUM: 8.5 mg/dL — AB (ref 8.9–10.3)
CO2: 27 mmol/L (ref 22–32)
CREATININE: 0.8 mg/dL (ref 0.44–1.00)
Chloride: 107 mmol/L (ref 101–111)
GFR calc Af Amer: >60 mL/min (ref >60)
GFR calc non Af Amer: >60 mL/min (ref >60)
Glucose, Bld: 90 mg/dL (ref 65–99)
Potassium: 3.5 mmol/L (ref 3.5–5.1)
Sodium: 139 mmol/L (ref 135–145)
TOTAL PROTEIN: 5.8 g/dL — AB (ref 6.5–8.1)

## 2016-05-30 LAB — CULTURE, BLOOD (ROUTINE X 2)
Culture: NO GROWTH
Culture: NO GROWTH

## 2016-05-30 LAB — LIPASE, BLOOD: Lipase: 18 U/L (ref 11–51)

## 2016-05-30 LAB — ANTINUCLEAR ANTIBODIES, IFA: ANA Ab, IFA: NEGATIVE

## 2016-05-30 LAB — ANTI-SMOOTH MUSCLE ANTIBODY, IGG: F-Actin IgG: 11 Units (ref 0–19)

## 2016-05-30 MED ORDER — PREMIER PROTEIN SHAKE
8.0000 [oz_av] | Freq: Three times a day (TID) | ORAL | Status: DC
  Administered 2016-05-30: 8 [oz_av] via ORAL
  Filled 2016-05-30: qty 325.31

## 2016-05-30 MED ORDER — HEPARIN SODIUM (PORCINE) 5000 UNIT/ML IJ SOLN
5000.0000 [IU] | Freq: Three times a day (TID) | INTRAMUSCULAR | Status: DC
  Administered 2016-05-30: 5000 [IU] via SUBCUTANEOUS
  Filled 2016-05-30: qty 1

## 2016-05-30 NOTE — Progress Notes (Signed)
PROGRESS NOTE                                                                                                                                                                                                             Patient Demographics:    Kaylee Roberts, is a 35 y.o. female, DOB - 1981-05-14, ZOX:[id number]  Admit date - 05/25/2016   Admitting Physician Tyrone Nineyan B Grunz, MD  Outpatient Primary MD for the patient is Lemont Fillers'SULLIVAN,MELISSA S., NP  LOS - 4  Outpatient Specialists: Gen Surgery Dr Daphine DeutscherMartin.  Chief Complaint  Patient presents with  . Abdominal Pain  . Emesis  . Rectal Bleeding       Brief Narrative   35 y.o. female with medical history significant for gastric bypass presenting for abdominal pain and rectal bleeding, as well febrile 102.1, with mild leukocytosis, CT abdomen and pelvis with no acute findings, Ultrasound significant for cholelithiasis, endoscopy and sigmoidoscopy 11/19 were unremarkable, Surgery consulted to evaluate for need of laparoscopic cholecystectomy secondary to biliary colic.   Subjective:    Kaylee QuestMarjorie Locey today has, No headache, No chest pain,  She reports abdominal pain after lunch today, nausea , denies any diarrhea or bright red blood per rectum .    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 - Hemoglobin remained stable,No recurrence of GI bleed for couple days  - GI input greatly appreciated , EGD and flexible sigmoidoscopy 11/19 unremarkable - Ultrasound significant for cholelithiasis, with slight bump in her LFTs, but hydroscan is negative. - Basically on IV Cipro, I will  stop today - Given patient complains of biliary colic, discussed with general surgery to evaluate patient for need of laparoscopic cholecystectomy.  Hypokalemia - Repleted  GERD  - continue with PPI  Normocytic anemia - Continue with iron supplement , hemoglobin with slight trend downward secondary to blood loss and delusional effect from IV fluids , remained stable    Code  Status : Full  Family Communication  : none at bedside  Disposition Plan  : Home when stable.  Consults  :  Gen surgery,GI  Procedures  : EGD/sigmoidoscopy  DVT Prophylaxis  : SCDs , will start on subcutaneous heparin today given no GI bleed over last 48 hours and normal sigmoidoscopy  Lab Results  Component Value Date   PLT 171 05/29/2016    Antibiotics  :    Anti-infectives    Start     Dose/Rate Route Frequency Ordered Stop   05/26/16 1000  metroNIDAZOLE (FLAGYL) tablet 500 mg  Status:  Discontinued     500 mg Oral Every 8 hours 05/26/16 0801 05/27/16 1303   05/26/16 0900  ciprofloxacin (CIPRO) IVPB 400 mg  Status:  Discontinued     400 mg 200 mL/hr over 60 Minutes Intravenous Every 12 hours 05/26/16 0801 05/30/16 1222        Objective:   Vitals:   05/29/16 1623 05/29/16 2151 05/30/16 0403 05/30/16 0537  BP: 116/77 127/82  116/74  Pulse: (!) 50 (!) 54  62  Resp: 18 16 13 16   Temp: 98.6 F (37 C) 99.2 F (37.3 C)  98.9 F (37.2 C)  TempSrc: Oral Oral  Oral  SpO2: 100% 100%  98%  Weight:      Height:        Wt Readings from Last 3 Encounters:  05/27/16 102.2 kg (225 lb 3.2 oz)  04/12/16 103.7 kg (228 lb 9.6 oz)  12/21/15 119.7 kg (264 lb)     Intake/Output Summary (Last 24 hours) at 05/30/16 1431 Last data filed at 05/30/16 1000  Gross per 24 hour  Intake          4844.89 ml  Output                0 ml  Net          4844.89 ml     Physical Exam  Awake Alert, Oriented X 3, No new F.N deficits, Normal affect [place].AT,PERRAL Supple Neck,No JVD, No cervical lymphadenopathy  appriciated.  Symmetrical Chest wall movement, Good air movement bilaterally, CTAB RRR,No Gallops,Rubs or new Murmurs, No Parasternal Heave +ve B.Sounds, Abd Soft, RUQ tenderness , No rebound - guarding or rigidity. No Cyanosis, Clubbing or edema, No new Rash or bruise      Data Review:    CBC  Recent Labs Lab 05/25/16 1151 05/25/16 2000 05/26/16 0419 05/27/16 0405 05/28/16 0404 05/29/16 0338  WBC 7.1  --  4.8 4.4 5.1 5.9  HGB 12.4  --  10.7* 10.5* 10.2* 10.3*  HCT 38.3 TEST REQUEST RECEIVED WITHOUT APPROPRIATE SPECIMEN 33.5* 33.2* 32.5* 32.5*  PLT 225  --  198 186 154 171  MCV 82.2  --  83.3 84.5 84.0 83.8  MCH 26.6  --  26.6 26.7 26.4 26.5  MCHC 32.4  --  31.9 31.6 31.4 31.7  RDW 14.2  --  14.6 14.7 14.7 14.6    Chemistries   Recent Labs Lab 05/25/16 1151 05/25/16 2000 05/26/16 0419 05/27/16 0405 05/28/16 0404 05/29/16 0338 05/30/16 0352  NA 136  --  137 138 140 140 139  K 3.4*  --  3.3* 3.6 3.4* 3.7 3.5  CL 103  --  107 107 108 108 107  CO2 23  --  24 25 23 26 27   GLUCOSE 100*  --  113* 98 86 89 90  BUN 16  --  11 7 5* 5* <5*  CREATININE 0.81  --  0.64 0.64 0.77 0.71 0.80  CALCIUM 9.4  --  8.5* 8.6* 8.5* 8.5* 8.5*  MG  --  1.7  --   --   --   --   --   AST 18  --  21  --  47* 23 21  ALT 14  --  19  --  106* 72* 56*  ALKPHOS 71  --  57  --  93 82 73  BILITOT 1.2  --  1.2  --  0.8 0.9 1.0   ------------------------------------------------------------------------------------------------------------------ No results for input(s): CHOL, HDL, LDLCALC, TRIG, CHOLHDL, LDLDIRECT in the last 72 hours.  Lab Results  Component Value Date   HGBA1C 5.1 03/31/2015   ------------------------------------------------------------------------------------------------------------------ No results for input(s): TSH, T4TOTAL, T3FREE, THYROIDAB in the last 72 hours.  Invalid input(s):  FREET3 ------------------------------------------------------------------------------------------------------------------ No results for input(s): VITAMINB12, FOLATE, FERRITIN, TIBC, IRON, RETICCTPCT in the last 72 hours.  Coagulation profile No results for input(s): INR, PROTIME in the last 168 hours.  No results for input(s): DDIMER in the last 72 hours.  Cardiac Enzymes No results for input(s): CKMB, TROPONINI, MYOGLOBIN in the last 168 hours.  Invalid input(s): CK ------------------------------------------------------------------------------------------------------------------ No results found for: BNP  Inpatient Medications  Scheduled Meds: . famotidine (PEPCID) IV  20 mg Intravenous Q12H  . heparin subcutaneous  5,000 Units Subcutaneous Q8H  . multivitamin with minerals  1 tablet Oral Daily  . ondansetron (ZOFRAN) IV  8 mg Intravenous Q8H   Continuous Infusions: . sodium chloride 0.9 % 1,000 mL with potassium chloride 10 mEq infusion 125 mL/hr at 05/30/16 1015   PRN Meds:.albuterol, oxyCODONE-acetaminophen, phenylephrine-shark liver oil-mineral oil-petrolatum, promethazine  Micro Results Recent Results (from the past 240 hour(s))  Blood culture (routine x 2)     Status: None   Collection Time: 05/25/16  1:20 PM  Result Value Ref Range Status   Specimen Description BLOOD LEFT ANTECUBITAL  Final   Special Requests BOTTLES DRAWN AEROBIC AND ANAEROBIC 5CC  Final   Culture   Final    NO GROWTH 5 DAYS Performed at [facility]    Report Status 05/30/2016 FINAL  Final  Blood culture (routine x 2)     Status: None   Collection Time: 05/25/16  1:26 PM  Result Value Ref Range Status   Specimen Description BLOOD BLOOD LEFT FOREARM  Final   Special Requests IN PEDIATRIC BOTTLE 4CC  Final   Culture   Final    NO GROWTH 5 DAYS Performed at [facility]    Report Status 05/30/2016 FINAL  Final  Wet prep, genital     Status: Abnormal   Collection Time: 05/25/16   4:50 PM  Result Value Ref Range Status   Yeast Wet Prep HPF POC NONE SEEN NONE SEEN Final   Trich, Wet Prep NONE SEEN NONE SEEN Final   Clue Cells Wet Prep HPF POC NONE SEEN NONE SEEN Final   WBC, Wet Prep HPF POC FEW (A) NONE SEEN Final   Sperm NONE SEEN  Final  Gastrointestinal Panel by PCR , Stool     Status: None   Collection Time: 05/26/16 10:32 AM  Result Value Ref Range Status   Campylobacter species NOT DETECTED NOT DETECTED Final   Plesimonas shigelloides NOT DETECTED NOT DETECTED Final   Salmonella species NOT DETECTED NOT DETECTED Final   Yersinia enterocolitica NOT DETECTED NOT DETECTED Final   Vibrio species NOT DETECTED NOT DETECTED Final   Vibrio cholerae NOT DETECTED NOT DETECTED Final  Enteroaggregative E coli (EAEC) NOT DETECTED NOT DETECTED Final   Enteropathogenic E coli (EPEC) NOT DETECTED NOT DETECTED Final   Enterotoxigenic E coli (ETEC) NOT DETECTED NOT DETECTED Final   Shiga like toxin producing E coli (STEC) NOT DETECTED NOT DETECTED Final   Shigella/Enteroinvasive E coli (EIEC) NOT DETECTED NOT DETECTED Final   Cryptosporidium NOT DETECTED NOT DETECTED Final   Cyclospora cayetanensis NOT DETECTED NOT DETECTED Final   Entamoeba histolytica NOT DETECTED NOT DETECTED Final   Giardia lamblia NOT DETECTED NOT DETECTED Final   Adenovirus F40/41 NOT DETECTED NOT DETECTED Final   Astrovirus NOT DETECTED NOT DETECTED Final   Norovirus GI/GII NOT DETECTED NOT DETECTED Final   Rotavirus A NOT DETECTED NOT DETECTED Final   Sapovirus (I, II, IV, and V) NOT DETECTED NOT DETECTED Final    Radiology Reports Nm Hepatobiliary Including Gb  Result Date: 05/29/2016 CLINICAL DATA:  Upper abdominal pain with nausea and vomiting. EXAM: NUCLEAR MEDICINE HEPATOBILIARY IMAGING TECHNIQUE: Sequential images of the abdomen were obtained out to 60 minutes following intravenous administration of radiopharmaceutical. RADIOPHARMACEUTICALS:  5.28 mCi Tc-55m  Choletec IV COMPARISON:   Ultrasound of May 27, 2016. FINDINGS: Prompt uptake and biliary excretion of activity by the liver is seen. Gallbladder activity is visualized, consistent with patency of cystic duct. Biliary activity passes into small bowel, consistent with patent common bile duct. IMPRESSION: Normal filling of gallbladder is noted. There is no evidence of cystic duct obstruction to suggest cholecystitis. Electronically Signed   By: Lupita Raider, M.D.   On: 05/29/2016 13:36   US Abdomen Complete  Result Date: 05/27/2016 CLINICAL DATA:  Upper abdominal pain, nausea, vomiting EXAM: ABDOMEN ULTRASOUND COMPLETE COMPARISON:  05/25/2016 FINDINGS: Gallbladder: Multiple small layering gallstones in the gallbladder, the largest 6 mm. No wall thickening. Negative sonographic Murphy's. Common bile duct: Diameter: Normal caliber, 6 mm Liver: No focal lesion identified. Within normal limits in parenchymal echogenicity. IVC: No abnormality visualized. Pancreas: Visualized portion unremarkable. Spleen: Enlarged with a craniocaudal length of 15.7 cm and a splenic volume of 1114 cubic cm. Right Kidney: Length: 11.4 cm. Echogenicity within normal limits. No mass or hydronephrosis visualized. Left Kidney: Length: 13.9 cm. Echogenicity within normal limits. No mass or hydronephrosis visualized. Abdominal aorta: No aneurysm visualized. Other findings: None. IMPRESSION: Cholelithiasis.  No sonographic evidence of acute cholecystitis. Splenomegaly. Electronically Signed   By: Charlett Nose M.D.   On: 05/27/2016 15:35   Ct Abdomen Pelvis W Contrast  Result Date: 05/25/2016 CLINICAL DATA:  Left-sided abdominal and rectal pain EXAM: CT ABDOMEN AND PELVIS WITH CONTRAST TECHNIQUE: Multidetector CT imaging of the abdomen and pelvis was performed using the standard protocol following bolus administration of intravenous contrast. CONTRAST:  ISOVUE-300 IOPAMIDOL (ISOVUE-300) INJECTION 61% COMPARISON:  None. FINDINGS: Lower chest:  No  contributory findings. Hepatobiliary: No focal liver abnormality.No evidence of biliary obstruction or stone. Pancreas: Unremarkable. Spleen: Splenomegaly and without focal mass. The spleen measures up to 15 cm, stable from 2016 sonography. No cirrhotic changes in the liver. Adrenals/Urinary Tract: Negative adrenals. No hydronephrosis or stone. Unremarkable bladder. Stomach/Bowel: Gastric bypass without obstruction or marginal inflammation. No appendicitis. Vascular/Lymphatic: No acute vascular abnormality. No mass or adenopathy. Reproductive:No pathologic findings. Other: No ascites or pneumoperitoneum. Musculoskeletal: No acute or aggressive finding. Disc degeneration with narrowing and endplate spurring greatest at L5-S1. IMPRESSION: 1. No explanation for pain. 2. Splenomegaly that is stable from 2016 sonography Electronically Signed   By: Marnee Spring M.D.   On: 05/25/2016 14:55  Randol KernELGERGAWY, Taeya Theall M.D on 05/30/2016 at 2:30 PM  Between 7am to 7pm - Pager - [phone]  After 7pm go to [email or web address] - password [email or web address]  [facility] -  Office  [phone]

## 2016-05-30 NOTE — Progress Notes (Signed)
Plan on lap chole with ioc, dx lap tomorrow afternoon.  Full note to follow tx to bariatric floor for routine bariatric care  Mary SellaEric M. Andrey CampanileWilson, MD, FACS General, Bariatric, & Minimally Invasive Surgery [facility] [place]

## 2016-05-30 NOTE — Progress Notes (Signed)
2 Days Post-Op  Subjective: Asked to see this gastric bypass patient of Dr. Martin's for ongoing postprandial right upper quadrant pain. He saw her early this week before going out of town on vacation. I collected her entire history. She is about 7 months out from Roux-en-Y gastric bypass. She has lost approximately 90 pounds. She is doing well with no significant issues. She had some bright red blood per rectum on the toilet paper and in the commode late last week. She contact the office who ordered a CBC. On way to have her blood drawn she developed some left sided upper abdominal discomfort as an chills. She also had some blood-tinged sputum. She decided to go to the emergency room. While there being evaluated she she states that she became lightheaded and passed out. She became febrile. She thinks that she hallucinated from the temperature. This led to a CT scan which was unremarkable. She was admitted for further testing and IV fluid hydration. She is undergone numerous test. Including ultrasound which showed gallstones without any evidence of cholecystitis, upper endoscopy, flexible sigmoidoscopy, and nuclear medicine scan of her gallbladder. She is also had numerous lab tests which have all been essentially unremarkable except for some normocytic anemia which is not uncommon after gastric bypass surgery. She reports compliance with her supplements as recommended. She may have some internal hemorrhoids which may account for the blood per rectum which has resolved. Upper endoscopy showed a normal pouch without any evidence of marginal ulcer or gastritis or esophagitis. The anastomosis was patent. She states that she was actually feeling better and was started back on a diet and that is when she started having pain within about 5 minutes of eating which she states is located underneath her right rib cage. She describes it as a spasm sensation. It lasts for about 10 minutes. She will get nauseous with it. No  vomiting. There is no longer any periumbilical, left sided or left upper quadrant pain.   She reports normal eating techniques and behaviors. She states this is different pain than if she ate too rapidly or didn't chew thoroughly enough  12 point ROS negative except for what is mentioned in HPI  Objective: Vital signs in last 24 hours: Temp:  [97.9 F (36.6 C)-99.2 F (37.3 C)] 97.9 F (36.6 C) (11/21 1440) Pulse Rate:  [54-62] 56 (11/21 1440) Resp:  [13-17] 17 (11/21 1440) BP: (113-127)/(74-82) 113/76 (11/21 1440) SpO2:  [98 %-100 %] 99 % (11/21 1440) Last BM Date: 05/28/16  Intake/Output from previous day: 11/20 0701 - 11/21 0700 In: 4244.1 [P.O.:480; I.V.:3056.1; IV Piggyback:708] Out: -  Intake/Output this shift: Total I/O In: 2190.8 [P.O.:450; I.V.:1540.8; IV Piggyback:200] Out: -   BP 113/76 (BP Location: Right Arm)   Pulse (!) 56   Temp 97.9 F (36.6 C) (Oral)   Resp 17   Ht 5' 7" (1.702 m)   Wt 102.2 kg (225 lb 3.2 oz)   LMP 05/17/2016 Comment: negative HCG blood/preg test 05/25/2016  SpO2 99%   BMI 35.27 kg/m   Gen: alert, NAD, non-toxic appearing Pupils: equal, no scleral icterus Pulm: Lungs clear to auscultation, symmetric chest rise CV: regular rate and rhythm Abd: soft, very mild tenderness in right upper quadrant, nondistended. Well-healed trocar sites. No cellulitis. No incisional hernia. No rebound or guarding Ext: no edema, no calf tenderness Skin: no rash, no jaundice Neuro: alert, ox3, nonfocal   Lab Results:   Recent Labs  05/28/16 0404 05/29/16 0338  WBC 5.1 5.9    HGB 10.2* 10.3*  HCT 32.5* 32.5*  PLT 154 171   BMET  Recent Labs  05/29/16 0338 05/30/16 0352  NA 140 139  K 3.7 3.5  CL 108 107  CO2 26 27  GLUCOSE 89 90  BUN 5* <5*  CREATININE 0.71 0.80  CALCIUM 8.5* 8.5*   PT/INR No results for input(s): LABPROT, INR in the last 72 hours. ABG No results for input(s): PHART, HCO3 in the last 72 hours.  Invalid input(s):  PCO2, PO2  Studies/Results: Nm Hepatobiliary Including Gb  Result Date: 05/29/2016 CLINICAL DATA:  Upper abdominal pain with nausea and vomiting. EXAM: NUCLEAR MEDICINE HEPATOBILIARY IMAGING TECHNIQUE: Sequential images of the abdomen were obtained out to 60 minutes following intravenous administration of radiopharmaceutical. RADIOPHARMACEUTICALS:  5.28 mCi Tc-99m  Choletec IV COMPARISON:  Ultrasound of May 27, 2016. FINDINGS: Prompt uptake and biliary excretion of activity by the liver is seen. Gallbladder activity is visualized, consistent with patency of cystic duct. Biliary activity passes into small bowel, consistent with patent common bile duct. IMPRESSION: Normal filling of gallbladder is noted. There is no evidence of cystic duct obstruction to suggest cholecystitis. Electronically Signed   By: James  Green Jr, M.D.   On: 05/29/2016 13:36    Anti-infectives: Anti-infectives    Start     Dose/Rate Route Frequency Ordered Stop   05/26/16 1000  metroNIDAZOLE (FLAGYL) tablet 500 mg  Status:  Discontinued     500 mg Oral Every 8 hours 05/26/16 0801 05/27/16 1303   05/26/16 0900  ciprofloxacin (CIPRO) IVPB 400 mg  Status:  Discontinued     400 mg 200 mL/hr over 60 Minutes Intravenous Every 12 hours 05/26/16 0801 05/30/16 1222      Assessment/Plan: s/p Procedure(s): ESOPHAGOGASTRODUODENOSCOPY (EGD) (N/A) FLEXIBLE SIGMOIDOSCOPY (N/A)  Status post Roux-en-Y gastric bypass by Dr. Martin about 7 months ago Has had great weight loss Cholelithiasis Right upper quadrant pain Normocytic anemia  She did have a very mild bump in her ALT but otherwise no significant LFT abnormalities. There is no ductal dilatation on any imaging. Her nuclear medicine scan was negative for cholecystitis. However a component of her story is suggestive of biliary colic. She certainly doesn't have the classic presentation for gallbladder disease but I don't think it's unreasonable to offer her cholecystectomy  now that she does have gallstones after her significant weight loss and she has some right upper quadrant pain that's occurring after eating. Her symptoms are also not entirely consistent with an internal hernia either. There is no evidence of internal hernia on CT. At this point I have offered her laparoscopic cholecystectomy with cholangiogram and diagnostic laparoscopy to rule out an internal hernia.   We discussed gallbladder disease.  I discussed laparoscopic cholecystectomy with IOC in detail.  The patient was shown diagrams detailing the procedure.  We discussed the risks and benefits of a laparoscopic cholecystectomy including, but not limited to bleeding, infection, injury to surrounding structures such as the intestine or liver, bile leak, retained gallstones, need to convert to an open procedure, prolonged diarrhea, blood clots such as  DVT, common bile duct injury, anesthesia risks, failure to ameliorate her pain, and possible need for additional procedures.  We discussed the typical post-operative recovery course. I explained that the likelihood of improvement of their symptoms is fair - good.  Cont bari supplements  More than 50% of today's visit was spent in counseling the patient  Quy Lotts M. Ziaire Hagos, MD, FACS General, Bariatric, & Minimally Invasive Surgery [facility],   [place]   LOS: 4 days    Tulsi Crossett M 05/30/2016 

## 2016-05-30 NOTE — Progress Notes (Signed)
Patient  attempted to eat lunch pears and cream potatoes with pain noted and feeling nauseated. Dr Avie ArenasArmbruste with GI notified.

## 2016-05-30 NOTE — Progress Notes (Signed)
Progress Note   Subjective  Overall patient feels better today. Pain is decreased, mild RUQ to epigastric area. When she laughs the pain can be worse. Slept well last night, tolerated clears today.    Objective   Vital signs in last 24 hours: Temp:  [98.6 F (37 C)-99.2 F (37.3 C)] 98.9 F (37.2 C) (11/21 0537) Pulse Rate:  [50-62] 62 (11/21 0537) Resp:  [13-18] 16 (11/21 0537) BP: (116-127)/(74-82) 116/74 (11/21 0537) SpO2:  [98 %-100 %] 98 % (11/21 0537) Last BM Date: 05/28/16 General:    white female in NAD Heart:  Regular rate and rhythm; no murmurs Lungs: Respirations even and unlabored, lungs CTA bilaterally Abdomen:  Soft, mild RUQ TTP, nondistended. Normal bowel sounds. Extremities:  Without edema. Neurologic:  Alert and oriented,  grossly normal neurologically. Psych:  Cooperative. Normal mood and affect.  Intake/Output from previous day: 11/20 0701 - 11/21 0700 In: 4244.1 [P.O.:480; I.V.:3056.1; IV Piggyback:708] Out: -  Intake/Output this shift: Total I/O In: 840.8 [P.O.:100; I.V.:540.8; IV Piggyback:200] Out: -   Lab Results:  Recent Labs  05/28/16 0404 05/29/16 0338  WBC 5.1 5.9  HGB 10.2* 10.3*  HCT 32.5* 32.5*  PLT 154 171   BMET  Recent Labs  05/28/16 0404 05/29/16 0338 05/30/16 0352  NA 140 140 139  K 3.4* 3.7 3.5  CL 108 108 107  CO2 23 26 27   GLUCOSE 86 89 90  BUN 5* 5* <5*  CREATININE 0.77 0.71 0.80  CALCIUM 8.5* 8.5* 8.5*   LFT  Recent Labs  05/30/16 0352  PROT 5.8*  ALBUMIN 3.0*  AST 21  ALT 56*  ALKPHOS 73  BILITOT 1.0   PT/INR No results for input(s): LABPROT, INR in the last 72 hours.  Studies/Results: Nm Hepatobiliary Including Gb  Result Date: 05/29/2016 CLINICAL DATA:  Upper abdominal pain with nausea and vomiting. EXAM: NUCLEAR MEDICINE HEPATOBILIARY IMAGING TECHNIQUE: Sequential images of the abdomen were obtained out to 60 minutes following intravenous administration of radiopharmaceutical.  RADIOPHARMACEUTICALS:  5.28 mCi Tc-8990m  Choletec IV COMPARISON:  Ultrasound of May 27, 2016. FINDINGS: Prompt uptake and biliary excretion of activity by the liver is seen. Gallbladder activity is visualized, consistent with patency of cystic duct. Biliary activity passes into small bowel, consistent with patent common bile duct. IMPRESSION: Normal filling of gallbladder is noted. There is no evidence of cystic duct obstruction to suggest cholecystitis. Electronically Signed   By: Lupita RaiderJames  Green Jr, M.D.   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 Workup to date has included CT scan, US, HIDA scan, EGD, and flex sig. Splenomegaly noted which appears stable / chronic, otherwise gallstones noted in GB without cholecystitis, no biliary ductal dilation. Endoscopies unremarkable with exception of hemorrhoids which may the source of her scant rectal bleeding. Mild ALT elevation noted which has since downtrended, AP and bili normal. HIDA scan done yesterday and normal. Overall she is better from admission but does continue to have some abdominal tenderness. I think a component of her pain is musculoskeletal from her vomiting. Serologies negative so far for acute viral hepatitis / AIH regarding. Monospot negative.  We discussed the finding of gallstones and her mild ALT elevation, which has since improved. She had no abnormality of bili / AP, and no biliary  ductal dilation on CT / US - I think it less likely that she passed a gallstone or[place] had choledocholithiasis. That being said she does have some postprandial RUQ pain which could be biliary colic, although her symptoms have changed since her admission and don't think this caused her entire presentation. At this time, we will advance her diet to regular and see how she does. If she can't  tolerate PO and continues to have significant pain may touch base with surgery about her gallstones, but her presentation is atypical. If she does well with her diet today and pain controlled I think she can be discharged this evening. Please call with questions.  Ileene PatrickSteven Tajuana Kniskern, MD [facility] Pager [phone]

## 2016-05-31 ENCOUNTER — Encounter (HOSPITAL_COMMUNITY)
Admission: EM | Disposition: A | Discharge status: Home / Self Care | Payer: Self-pay | Source: Home / Self Care | Attending: Internal Medicine

## 2016-05-31 ENCOUNTER — Encounter (HOSPITAL_COMMUNITY): Payer: Self-pay

## 2016-05-31 ENCOUNTER — Inpatient Hospital Stay (HOSPITAL_COMMUNITY): Payer: 59 | Admitting: Anesthesiology

## 2016-05-31 ENCOUNTER — Inpatient Hospital Stay (HOSPITAL_COMMUNITY): Payer: 59

## 2016-05-31 DIAGNOSIS — K8020 Calculus of gallbladder without cholecystitis without obstruction: Secondary | ICD-10-CM

## 2016-05-31 DIAGNOSIS — K625 Hemorrhage of anus and rectum: Secondary | ICD-10-CM

## 2016-05-31 HISTORY — PX: CHOLECYSTECTOMY: SHX55

## 2016-05-31 LAB — BASIC METABOLIC PANEL
Anion gap: 7 (ref 5–15)
BUN: 8 mg/dL (ref 6–20)
CO2: 26 mmol/L (ref 22–32)
CREATININE: 0.73 mg/dL (ref 0.44–1.00)
Calcium: 8.5 mg/dL — ABNORMAL LOW (ref 8.9–10.3)
Chloride: 107 mmol/L (ref 101–111)
GFR calc Af Amer: >60 mL/min (ref >60)
GLUCOSE: 89 mg/dL (ref 65–99)
POTASSIUM: 3.5 mmol/L (ref 3.5–5.1)
SODIUM: 140 mmol/L (ref 135–145)

## 2016-05-31 LAB — SURGICAL PCR SCREEN
MRSA, PCR: NEGATIVE
Staphylococcus aureus: NEGATIVE

## 2016-05-31 LAB — VITAMIN B1: Vitamin B1 (Thiamine): 148.9 nmol/L (ref 66.5–200.0)

## 2016-05-31 LAB — CBC
HCT: 33 % — ABNORMAL LOW (ref 36.0–46.0)
Hemoglobin: 10.6 g/dL — ABNORMAL LOW (ref 12.0–15.0)
MCH: 26.7 pg (ref 26.0–34.0)
MCHC: 32.1 g/dL (ref 30.0–36.0)
MCV: 83.1 fL (ref 78.0–100.0)
PLATELETS: 189 10*3/uL (ref 150–400)
RBC: 3.97 MIL/uL (ref 3.87–5.11)
RDW: 14.6 % (ref 11.5–15.5)
WBC: 7.7 10*3/uL (ref 4.0–10.5)

## 2016-05-31 SURGERY — LAPAROSCOPIC CHOLECYSTECTOMY WITH INTRAOPERATIVE CHOLANGIOGRAM
Anesthesia: General

## 2016-05-31 MED ORDER — SUCCINYLCHOLINE CHLORIDE 200 MG/10ML IV SOSY
PREFILLED_SYRINGE | INTRAVENOUS | Status: DC | PRN
  Administered 2016-05-31: 120 mg via INTRAVENOUS

## 2016-05-31 MED ORDER — LIDOCAINE HCL (CARDIAC) 20 MG/ML IV SOLN
INTRAVENOUS | Status: DC | PRN
  Administered 2016-05-31: 100 mg via INTRAVENOUS

## 2016-05-31 MED ORDER — MORPHINE SULFATE (PF) 2 MG/ML IV SOLN
1.0000 mg | INTRAVENOUS | Status: DC | PRN
  Administered 2016-05-31: 2 mg via INTRAVENOUS
  Filled 2016-05-31: qty 1

## 2016-05-31 MED ORDER — PROMETHAZINE HCL 25 MG/ML IJ SOLN: 12.5000 mg | Freq: Four times a day (QID) | INTRAMUSCULAR | Status: DC | PRN

## 2016-05-31 MED ORDER — DEXMEDETOMIDINE HCL IN NACL 200 MCG/50ML IV SOLN
INTRAVENOUS | Status: AC
  Filled 2016-05-31: qty 50

## 2016-05-31 MED ORDER — CEFAZOLIN SODIUM-DEXTROSE 2-3 GM-% IV SOLR
INTRAVENOUS | Status: DC | PRN
  Administered 2016-05-31: 2 g via INTRAVENOUS

## 2016-05-31 MED ORDER — FENTANYL CITRATE (PF) 100 MCG/2ML IJ SOLN
INTRAMUSCULAR | Status: AC
  Filled 2016-05-31: qty 2

## 2016-05-31 MED ORDER — BUPIVACAINE HCL (PF) 0.5 % IJ SOLN
INTRAMUSCULAR | Status: AC
  Filled 2016-05-31: qty 30

## 2016-05-31 MED ORDER — IOPAMIDOL (ISOVUE-300) INJECTION 61%
INTRAVENOUS | Status: AC
  Filled 2016-05-31: qty 50

## 2016-05-31 MED ORDER — ROCURONIUM BROMIDE 50 MG/5ML IV SOSY
PREFILLED_SYRINGE | INTRAVENOUS | Status: AC
  Filled 2016-05-31: qty 5

## 2016-05-31 MED ORDER — FENTANYL CITRATE (PF) 100 MCG/2ML IJ SOLN
25.0000 ug | INTRAMUSCULAR | Status: DC | PRN
  Administered 2016-05-31 (×4): 50 ug via INTRAVENOUS

## 2016-05-31 MED ORDER — HEPARIN SODIUM (PORCINE) 5000 UNIT/ML IJ SOLN
5000.0000 [IU] | Freq: Three times a day (TID) | INTRAMUSCULAR | Status: DC
  Administered 2016-06-01: 5000 [IU] via SUBCUTANEOUS
  Filled 2016-05-31 (×2): qty 1

## 2016-05-31 MED ORDER — FENTANYL CITRATE (PF) 100 MCG/2ML IJ SOLN
INTRAMUSCULAR | Status: DC | PRN
  Administered 2016-05-31 (×7): 50 ug via INTRAVENOUS

## 2016-05-31 MED ORDER — FENTANYL CITRATE (PF) 250 MCG/5ML IJ SOLN
INTRAMUSCULAR | Status: AC
  Filled 2016-05-31: qty 5

## 2016-05-31 MED ORDER — MIDAZOLAM HCL 5 MG/5ML IJ SOLN
INTRAMUSCULAR | Status: DC | PRN
  Administered 2016-05-31: 2 mg via INTRAVENOUS

## 2016-05-31 MED ORDER — ONDANSETRON HCL 4 MG/2ML IJ SOLN: 4.0000 mg | Freq: Once | INTRAMUSCULAR | Status: DC | PRN

## 2016-05-31 MED ORDER — LACTATED RINGERS IV SOLN
INTRAVENOUS | Status: DC | PRN
  Administered 2016-05-31: 18:00:00 via INTRAVENOUS

## 2016-05-31 MED ORDER — DEXAMETHASONE SODIUM PHOSPHATE 10 MG/ML IJ SOLN
INTRAMUSCULAR | Status: AC
  Filled 2016-05-31: qty 1

## 2016-05-31 MED ORDER — CEFAZOLIN SODIUM-DEXTROSE 2-4 GM/100ML-% IV SOLN
INTRAVENOUS | Status: AC
  Filled 2016-05-31: qty 100

## 2016-05-31 MED ORDER — MIDAZOLAM HCL 2 MG/2ML IJ SOLN
INTRAMUSCULAR | Status: AC
Start: 2016-05-31 — End: 2016-05-31
  Filled 2016-05-31: qty 2

## 2016-05-31 MED ORDER — ONDANSETRON HCL 4 MG/2ML IJ SOLN
INTRAMUSCULAR | Status: DC | PRN
  Administered 2016-05-31: 4 mg via INTRAVENOUS

## 2016-05-31 MED ORDER — ONDANSETRON HCL 4 MG/2ML IJ SOLN
INTRAMUSCULAR | Status: AC
  Filled 2016-05-31: qty 2

## 2016-05-31 MED ORDER — PROPOFOL 10 MG/ML IV BOLUS
INTRAVENOUS | Status: DC | PRN
  Administered 2016-05-31: 170 mg via INTRAVENOUS

## 2016-05-31 MED ORDER — FENTANYL CITRATE (PF) 100 MCG/2ML IJ SOLN
INTRAMUSCULAR | Status: AC
  Administered 2016-05-31: 50 ug via INTRAVENOUS
  Filled 2016-05-31: qty 2

## 2016-05-31 MED ORDER — ROCURONIUM BROMIDE 10 MG/ML (PF) SYRINGE
PREFILLED_SYRINGE | INTRAVENOUS | Status: DC | PRN
  Administered 2016-05-31: 50 mg via INTRAVENOUS
  Administered 2016-05-31: 10 mg via INTRAVENOUS

## 2016-05-31 MED ORDER — LACTATED RINGERS IR SOLN
Status: DC | PRN
  Administered 2016-05-31: 1000 mL

## 2016-05-31 MED ORDER — SUGAMMADEX SODIUM 200 MG/2ML IV SOLN
INTRAVENOUS | Status: AC
  Filled 2016-05-31: qty 2

## 2016-05-31 MED ORDER — LACTATED RINGERS IV SOLN
INTRAVENOUS | Status: DC
  Administered 2016-05-31: 1000 mL via INTRAVENOUS

## 2016-05-31 MED ORDER — DEXAMETHASONE SODIUM PHOSPHATE 10 MG/ML IJ SOLN
INTRAMUSCULAR | Status: DC | PRN
  Administered 2016-05-31: 10 mg via INTRAVENOUS

## 2016-05-31 MED ORDER — LACTATED RINGERS IV SOLN: INTRAVENOUS | Status: DC

## 2016-05-31 MED ORDER — BUPIVACAINE HCL (PF) 0.5 % IJ SOLN
INTRAMUSCULAR | Status: DC | PRN
  Administered 2016-05-31: 15 mL

## 2016-05-31 MED ORDER — HYDROMORPHONE HCL 1 MG/ML IJ SOLN
INTRAMUSCULAR | Status: AC
  Filled 2016-05-31: qty 1

## 2016-05-31 MED ORDER — HYDROMORPHONE HCL 1 MG/ML IJ SOLN
0.2500 mg | INTRAMUSCULAR | Status: DC | PRN
  Administered 2016-05-31 (×2): 0.5 mg via INTRAVENOUS

## 2016-05-31 MED ORDER — SUGAMMADEX SODIUM 200 MG/2ML IV SOLN
INTRAVENOUS | Status: DC | PRN
  Administered 2016-05-31: 200 mg via INTRAVENOUS

## 2016-05-31 SURGICAL SUPPLY — 44 items
APPLICATOR ARISTA FLEXITIP XL (MISCELLANEOUS) IMPLANT
APPLIER CLIP 5 13 M/L LIGAMAX5 (MISCELLANEOUS) ×2
APPLIER CLIP ROT 10 11.4 M/L (STAPLE)
BANDAGE ADH SHEER 1  50/CT (GAUZE/BANDAGES/DRESSINGS) ×8 IMPLANT
BENZOIN TINCTURE PRP APPL 2/3 (GAUZE/BANDAGES/DRESSINGS) ×2 IMPLANT
CABLE HIGH FREQUENCY MONO STRZ (ELECTRODE) ×2 IMPLANT
CHLORAPREP W/TINT 26ML (MISCELLANEOUS) ×2 IMPLANT
CLIP APPLIE 5 13 M/L LIGAMAX5 (MISCELLANEOUS) ×1 IMPLANT
CLIP APPLIE ROT 10 11.4 M/L (STAPLE) IMPLANT
CLOSURE STERI-STRIP 1/4X4 (GAUZE/BANDAGES/DRESSINGS) IMPLANT
COVER MAYO STAND STRL (DRAPES) IMPLANT
COVER SURGICAL LIGHT HANDLE (MISCELLANEOUS) ×2 IMPLANT
DECANTER SPIKE VIAL GLASS SM (MISCELLANEOUS) ×2 IMPLANT
DRAPE C-ARM 42X120 X-RAY (DRAPES) IMPLANT
DRSG TEGADERM 2-3/8X2-3/4 SM (GAUZE/BANDAGES/DRESSINGS) ×2 IMPLANT
ELECT PENCIL ROCKER SW 15FT (MISCELLANEOUS) IMPLANT
ELECT REM PT RETURN 9FT ADLT (ELECTROSURGICAL) ×2
ELECTRODE REM PT RTRN 9FT ADLT (ELECTROSURGICAL) ×1 IMPLANT
GAUZE SPONGE 2X2 8PLY STRL LF (GAUZE/BANDAGES/DRESSINGS) ×1 IMPLANT
GLOVE BIO SURGEON STRL SZ7.5 (GLOVE) ×2 IMPLANT
GLOVE INDICATOR 8.0 STRL GRN (GLOVE) ×2 IMPLANT
GOWN STRL REUS W/TWL XL LVL3 (GOWN DISPOSABLE) ×6 IMPLANT
HEMOSTAT ARISTA ABSORB 3G PWDR (MISCELLANEOUS) IMPLANT
HEMOSTAT SNOW SURGICEL 2X4 (HEMOSTASIS) IMPLANT
IRRIG SUCT STRYKERFLOW 2 WTIP (MISCELLANEOUS) ×2
IRRIGATION SUCT STRKRFLW 2 WTP (MISCELLANEOUS) ×1 IMPLANT
KIT BASIN OR (CUSTOM PROCEDURE TRAY) ×2 IMPLANT
L-HOOK LAP DISP 36CM (ELECTROSURGICAL)
LHOOK LAP DISP 36CM (ELECTROSURGICAL) IMPLANT
POUCH RETRIEVAL ECOSAC 10 (ENDOMECHANICALS) ×1 IMPLANT
POUCH RETRIEVAL ECOSAC 10MM (ENDOMECHANICALS) ×1
SCISSORS LAP 5X35 DISP (ENDOMECHANICALS) ×2 IMPLANT
SET CHOLANGIOGRAPH MIX (MISCELLANEOUS) IMPLANT
SLEEVE XCEL OPT CAN 5 100 (ENDOMECHANICALS) ×4 IMPLANT
SPONGE GAUZE 2X2 STER 10/PKG (GAUZE/BANDAGES/DRESSINGS) ×1
SUT MNCRL AB 4-0 PS2 18 (SUTURE) ×2 IMPLANT
SUT VICRYL 0 UR6 27IN ABS (SUTURE) IMPLANT
TOWEL OR 17X26 10 PK STRL BLUE (TOWEL DISPOSABLE) ×2 IMPLANT
TOWEL OR NON WOVEN STRL DISP B (DISPOSABLE) ×2 IMPLANT
TRAY LAPAROSCOPIC (CUSTOM PROCEDURE TRAY) ×2 IMPLANT
TROCAR BLADELESS OPT 5 100 (ENDOMECHANICALS) ×2 IMPLANT
TROCAR XCEL BLUNT TIP 100MML (ENDOMECHANICALS) ×2 IMPLANT
TROCAR XCEL NON-BLD 11X100MML (ENDOMECHANICALS) IMPLANT
TUBING INSUF HEATED (TUBING) ×2 IMPLANT

## 2016-05-31 NOTE — Interval H&P Note (Signed)
History and Physical Interval Note:  05/31/2016 5:17 PM  Kaylee Roberts  has presented today for surgery, with the diagnosis of Gallstones, diagnostic lap  The various methods of treatment have been discussed with the patient and family. After consideration of risks, benefits and other options for treatment, the patient has consented to  Procedure(s): LAPAROSCOPIC CHOLECYSTECTOMY WITH INTRAOPERATIVE CHOLANGIOGRAM (N/A) as a surgical intervention .  The patient's history has been reviewed, patient examined, no change in status, stable for surgery.  I have reviewed the patient's chart and labs.  Questions were answered to the patient's satisfaction.    Mary SellaEric M. Andrey CampanileWilson, MD, FACS General, Bariatric, & Minimally Invasive Surgery [facility], [place]    [facility]WILSON,Delesha Pohlman M

## 2016-05-31 NOTE — Anesthesia Procedure Notes (Signed)
Procedure Name: Intubation Date/Time: 05/31/2016 6:06 PM Performed by: Jarvis NewcomerARMISTEAD, Romy Ipock A Pre-anesthesia Checklist: Patient identified, Emergency Drugs available, Suction available, Patient being monitored and Timeout performed Patient Re-evaluated:Patient Re-evaluated prior to inductionOxygen Delivery Method: Circle system utilized Preoxygenation: Pre-oxygenation with 100% oxygen Intubation Type: IV induction Ventilation: Mask ventilation without difficulty Laryngoscope Size: Mac and 4 Grade View: Grade I Tube type: Oral Tube size: 7.5 mm Number of attempts: 1 Airway Equipment and Method: Stylet Placement Confirmation: ETT inserted through vocal cords under direct vision,  positive ETCO2 and breath sounds checked- equal and bilateral Secured at: 21 cm Tube secured with: Tape Dental Injury: Teeth and Oropharynx as per pre-operative assessment

## 2016-05-31 NOTE — Progress Notes (Signed)
Patient taken to OR  At this time. Stable.

## 2016-05-31 NOTE — Transfer of Care (Signed)
Immediate Anesthesia Transfer of Care Note  Patient: Kaylee Roberts  Procedure(s) Performed: Procedure(s): LAPAROSCOPIC CHOLECYSTECTOMY (N/A)  Patient Location: PACU  Anesthesia Type:General  Level of Consciousness: awake, alert , oriented and patient cooperative  Airway & Oxygen Therapy: Patient Spontanous Breathing and Patient connected to face mask oxygen  Post-op Assessment: Report given to RN, Post -op Vital signs reviewed and stable and Patient moving all extremities  Post vital signs: Reviewed and stable  Last Vitals:  Vitals:   05/31/16 0551 05/31/16 1415  BP: 129/86 122/84  Pulse: (!) 47 66  Resp: 18 16  Temp: 36.4 C 36.8 C    Last Pain:  Vitals:   05/31/16 1433  TempSrc:   PainSc: 0-No pain      Patients Stated Pain Goal: 2 (05/31/16 1433)  Complications: No apparent anesthesia complications

## 2016-05-31 NOTE — Op Note (Signed)
Kaylee Roberts [id number] February 12, 1981 05/31/2016  Laparoscopic Cholecystectomy with attempted IOC Procedure Note  Indications: This patient has undergone laparoscopic Roux-en-Y gastric bypass by Dr. Daphine DeutscherMartin approximate 7 months ago and has done very well with a 90 pound weight loss. She presented to the hospital last Thursday with multiple complaints including blood per rectum, left-sided abdominal pain and reportedly a syncopal episode in the ER. She was admitted and started on fluids and underwent an extensive workup. She had a CT scan which was unremarkable. She had a flexible sigmoidoscopy which just revealed internal hemorrhoids. She had no ongoing melena or hematochezia. She underwent upper endoscopy which showed a normal gastric pouch and a normal anastomosis without any evidence of gastritis or marginal ulcer. She underwent abdominal ultrasound which revealed cholelithiasis without evidence of cholecystitis. She did have a small bump in her ALT level. She was started back on a diet but developed postprandial right upper quadrant pain underneath her rib cage that she described as a spasm. A nuclear medicine scan was performed which demonstrated no biliary obstruction and the gallbladder was visualized. Weaver consult to to evaluate the patient for possible biliary colic. Please see my note from yesterday in the patient's chart regarding my discussion with the patient.  Pre-operative Diagnosis: Right upper quadrant pain, cholelithiasis, history of Roux-en-Y gastric bypass  Post-operative Diagnosis: Same  Surgeon: Atilano InaWILSON,Felix Meras M   Assistants: Romie LeveeAlicia Thomas, M.D.  Anesthesia: General endotracheal anesthesia  Procedure Details  The patient was seen again in the Holding Room. The risks, benefits, complications, treatment options, and expected outcomes were discussed with the patient. The possibilities of reaction to medication, pulmonary aspiration, perforation of viscus, bleeding, recurrent  infection, finding a normal gallbladder, the need for additional procedures, failure to diagnose a condition, the possible need to convert to an open procedure, and creating a complication requiring transfusion or operation were discussed with the patient. The likelihood of improving the patient's symptoms with return to their baseline status is good.  The patient and/or family concurred with the proposed plan, giving informed consent. The site of surgery properly noted. The patient was taken to Operating Room, identified as Kaylee Roberts and the procedure verified as Laparoscopic Cholecystectomy with Intraoperative Cholangiogram. A Time Out was held and the above information confirmed. Antibiotic prophylaxis was administered.   Prior to the induction of general anesthesia, antibiotic prophylaxis was administered. General endotracheal anesthesia was then administered and tolerated well. After the induction, the abdomen was prepped with Chloraprep and draped in the sterile fashion. The patient was positioned in the supine position.  Local anesthetic agent was injected into the skin near the umbilicus and an incision made. We dissected down to the abdominal fascia with blunt dissection.  The fascia was incised vertically and we entered the peritoneal cavity bluntly.  A pursestring suture of 0-Vicryl was placed around the fascial opening.  The Hasson cannula was inserted and secured with the stay suture.  Pneumoperitoneum was then created with CO2 and tolerated well without any adverse changes in the patient's vital signs. An 5-mm port was placed in the subxiphoid position.  Two 5-mm ports were placed in the right upper quadrant. All skin incisions were infiltrated with a local anesthetic agent before making the incision and placing the trocars.   We positioned the patient in reverse Trendelenburg, tilted slightly to the patient's left.  The gallbladder was identified, the fundus grasped and retracted cephalad.  Adhesions were lysed bluntly and with the electrocautery where indicated, taking care not to injure any  adjacent organs or viscus. The infundibulum was grasped and retracted laterally, exposing the peritoneum overlying the triangle of Calot. This was then divided and exposed in a blunt fashion. A critical view of the cystic duct was obtained. There was a large window between the cystic duct and the gallbladder and the liver was visualized. The cystic duct was clearly identified and bluntly dissected circumferentially. The cystic duct was ligated with a clip distally.   An incision was made in the cystic duct and there was brisk bleeding. While we thought we had isolated the cystic artery previously during our dissection she apparently had the cystic artery sitting anterior directly on top of her cystic duct. A clip was placed across the cystic artery and hemostasis is achieved. She had a very tiny cystic duct. I attempted to introduce the [facility] catheter but was unable to cannulate the cystic duct. At this point I decided to no longer attempt to thread the catheter .The catheter was then removed.   The cystic duct was then ligated with clips and divided.    The gallbladder was dissected from the liver bed in retrograde fashion with the electrocautery. There was some spillage of bile from the gallbladder as it was mobilized from the liver bed. The gallbladder was removed and placed in an Ecco sac.  The gallbladder and Ecco sac were then removed through the umbilical port site. The liver bed was irrigated and inspected. Hemostasis was achieved with the electrocautery. Copious irrigation was utilized and was repeatedly aspirated until clear.    At this point I went to the patient's right side and obtained to atraumatic bowel graspers. I visualized her pouch which appeared in normal size. The Roux limb was visualized. She did have a fairly long candycane limb. The Roux limb was traced down to the  jejunojejunostomy. The common channel was visualized. There is no evidence of a mesenteric defect.  The pursestring suture was used to close the umbilical fascia.    We again inspected the right upper quadrant for hemostasis.  The umbilical closure was inspected and there was no air leak and nothing trapped within the closure. However they're felt to be a little bit of a gap or weakness. An additional interrupted 0 Vicryl suture was placed. This felt more secure. There is no air leak. Pneumoperitoneum was released as we removed the trocars.  4-0 Monocryl was used to close the skin.   Benzoin, steri-strips, and clean dressings were applied. The patient was then extubated and brought to the recovery room in stable condition. Instrument, sponge, and needle counts were correct at closure and at the conclusion of the case.   Findings: Positive critical view, no evidence of mesenteric defect at the jejunojejunostomy  Estimated Blood Loss: less than 50 mL         Drains: None         Specimens: Gallbladder           Complications: None; patient tolerated the procedure well.         Disposition: PACU - hemodynamically stable.         Condition: stable  Mary SellaEric M. Andrey CampanileWilson, MD, FACS General, Bariatric, & Minimally Invasive Surgery [facility], [place]

## 2016-05-31 NOTE — Progress Notes (Signed)
CHG wipes performed.

## 2016-05-31 NOTE — Anesthesia Preprocedure Evaluation (Addendum)
Anesthesia Evaluation  Patient identified by MRN, date of birth, ID band Patient awake    Reviewed: Allergy & Precautions, NPO status   Airway Mallampati: II   Neck ROM: Full    Dental  (+) Teeth Intact, Dental Advisory Given   Pulmonary former smoker,    breath sounds clear to auscultation       Cardiovascular  Rhythm:Regular Rate:Normal     Neuro/Psych    GI/Hepatic   Endo/Other    Renal/GU      Musculoskeletal   Abdominal   Peds  Hematology   Anesthesia Other Findings   Reproductive/Obstetrics                             Anesthesia Physical Anesthesia Plan  ASA: III  Anesthesia Plan: General   Post-op Pain Management:    Induction: Intravenous  Airway Management Planned: Oral ETT  Additional Equipment:   Intra-op Plan:   Post-operative Plan: Extubation in OR  Informed Consent: I have reviewed the patients History and Physical, chart, labs and discussed the procedure including the risks, benefits and alternatives for the proposed anesthesia with the patient or authorized representative who has indicated his/her understanding and acceptance.   Dental advisory given  Plan Discussed with: CRNA and Anesthesiologist  Anesthesia Plan Comments:         Anesthesia Quick Evaluation

## 2016-05-31 NOTE — Progress Notes (Signed)
May give additional 50 mcg fentany.  May give Dilaudid 2 mg in pacu if needed per Dr Noreene LarssonJoslin

## 2016-05-31 NOTE — H&P (View-Only) (Signed)
2 Days Post-Op  Subjective: Asked to see this gastric bypass patient of Dr. Ermalene SearingMartin's for ongoing postprandial right upper quadrant pain. He saw her early this week before going out of town on vacation. I collected her entire history. She is about 7 months out from Roux-en-Y gastric bypass. She has lost approximately 90 pounds. She is doing well with no significant issues. She had some bright red blood per rectum on the toilet paper and in the commode late last week. She contact the office who ordered a CBC. On way to have her blood drawn she developed some left sided upper abdominal discomfort as an chills. She also had some blood-tinged sputum. She decided to go to the emergency room. While there being evaluated she she states that she became lightheaded and passed out. She became febrile. She thinks that she hallucinated from the temperature. This led to a CT scan which was unremarkable. She was admitted for further testing and IV fluid hydration. She is undergone numerous test. Including ultrasound which showed gallstones without any evidence of cholecystitis, upper endoscopy, flexible sigmoidoscopy, and nuclear medicine scan of her gallbladder. She is also had numerous lab tests which have all been essentially unremarkable except for some normocytic anemia which is not uncommon after gastric bypass surgery. She reports compliance with her supplements as recommended. She may have some internal hemorrhoids which may account for the blood per rectum which has resolved. Upper endoscopy showed a normal pouch without any evidence of marginal ulcer or gastritis or esophagitis. The anastomosis was patent. She states that she was actually feeling better and was started back on a diet and that is when she started having pain within about 5 minutes of eating which she states is located underneath her right rib cage. She describes it as a spasm sensation. It lasts for about 10 minutes. She will get nauseous with it. No  vomiting. There is no longer any periumbilical, left sided or left upper quadrant pain.   She reports normal eating techniques and behaviors. She states this is different pain than if she ate too rapidly or didn't chew thoroughly enough  12 point ROS negative except for what is mentioned in HPI  Objective: Vital signs in last 24 hours: Temp:  [97.9 F (36.6 C)-99.2 F (37.3 C)] 97.9 F (36.6 C) (11/21 1440) Pulse Rate:  [54-62] 56 (11/21 1440) Resp:  [13-17] 17 (11/21 1440) BP: (113-127)/(74-82) 113/76 (11/21 1440) SpO2:  [98 %-100 %] 99 % (11/21 1440) Last BM Date: 05/28/16  Intake/Output from previous day: 11/20 0701 - 11/21 0700 In: 4244.1 [P.O.:480; I.V.:3056.1; IV Piggyback:708] Out: -  Intake/Output this shift: Total I/O In: 2190.8 [P.O.:450; I.V.:1540.8; IV Piggyback:200] Out: -   BP 113/76 (BP Location: Right Arm)   Pulse (!) 56   Temp 97.9 F (36.6 C) (Oral)   Resp 17   Ht 5\' 7"  (1.702 m)   Wt 102.2 kg (225 lb 3.2 oz)   LMP 05/17/2016 Comment: negative HCG blood/preg test 05/25/2016  SpO2 99%   BMI 35.27 kg/m   Gen: alert, NAD, non-toxic appearing Pupils: equal, no scleral icterus Pulm: Lungs clear to auscultation, symmetric chest rise CV: regular rate and rhythm Abd: soft, very mild tenderness in right upper quadrant, nondistended. Well-healed trocar sites. No cellulitis. No incisional hernia. No rebound or guarding Ext: no edema, no calf tenderness Skin: no rash, no jaundice Neuro: alert, ox3, nonfocal   Lab Results:   Recent Labs  05/28/16 0404 05/29/16 0338  WBC 5.1 5.9  HGB 10.2* 10.3*  HCT 32.5* 32.5*  PLT 154 171   BMET  Recent Labs  05/29/16 0338 05/30/16 0352  NA 140 139  K 3.7 3.5  CL 108 107  CO2 26 27  GLUCOSE 89 90  BUN 5* <5*  CREATININE 0.71 0.80  CALCIUM 8.5* 8.5*   PT/INR No results for input(s): LABPROT, INR in the last 72 hours. ABG No results for input(s): PHART, HCO3 in the last 72 hours.  Invalid input(s):  PCO2, PO2  Studies/Results: Nm Hepatobiliary Including Gb  Result Date: 05/29/2016 CLINICAL DATA:  Upper abdominal pain with nausea and vomiting. EXAM: NUCLEAR MEDICINE HEPATOBILIARY IMAGING TECHNIQUE: Sequential images of the abdomen were obtained out to 60 minutes following intravenous administration of radiopharmaceutical. RADIOPHARMACEUTICALS:  5.28 mCi Tc-88m  Choletec IV COMPARISON:  Ultrasound of May 27, 2016. FINDINGS: Prompt uptake and biliary excretion of activity by the liver is seen. Gallbladder activity is visualized, consistent with patency of cystic duct. Biliary activity passes into small bowel, consistent with patent common bile duct. IMPRESSION: Normal filling of gallbladder is noted. There is no evidence of cystic duct obstruction to suggest cholecystitis. Electronically Signed   By: Lupita Raider, M.D.   On: 05/29/2016 13:36    Anti-infectives: Anti-infectives    Start     Dose/Rate Route Frequency Ordered Stop   05/26/16 1000  metroNIDAZOLE (FLAGYL) tablet 500 mg  Status:  Discontinued     500 mg Oral Every 8 hours 05/26/16 0801 05/27/16 1303   05/26/16 0900  ciprofloxacin (CIPRO) IVPB 400 mg  Status:  Discontinued     400 mg 200 mL/hr over 60 Minutes Intravenous Every 12 hours 05/26/16 0801 05/30/16 1222      Assessment/Plan: s/p Procedure(s): ESOPHAGOGASTRODUODENOSCOPY (EGD) (N/A) FLEXIBLE SIGMOIDOSCOPY (N/A)  Status post Roux-en-Y gastric bypass by Dr. Daphine Deutscher about 7 months ago Has had great weight loss Cholelithiasis Right upper quadrant pain Normocytic anemia  She did have a very mild bump in her ALT but otherwise no significant LFT abnormalities. There is no ductal dilatation on any imaging. Her nuclear medicine scan was negative for cholecystitis. However a component of her story is suggestive of biliary colic. She certainly doesn't have the classic presentation for gallbladder disease but I don't think it's unreasonable to offer her cholecystectomy  now that she does have gallstones after her significant weight loss and she has some right upper quadrant pain that's occurring after eating. Her symptoms are also not entirely consistent with an internal hernia either. There is no evidence of internal hernia on CT. At this point I have offered her laparoscopic cholecystectomy with cholangiogram and diagnostic laparoscopy to rule out an internal hernia.   We discussed gallbladder disease.  I discussed laparoscopic cholecystectomy with IOC in detail.  The patient was shown diagrams detailing the procedure.  We discussed the risks and benefits of a laparoscopic cholecystectomy including, but not limited to bleeding, infection, injury to surrounding structures such as the intestine or liver, bile leak, retained gallstones, need to convert to an open procedure, prolonged diarrhea, blood clots such as  DVT, common bile duct injury, anesthesia risks, failure to ameliorate her pain, and possible need for additional procedures.  We discussed the typical post-operative recovery course. I explained that the likelihood of improvement of their symptoms is fair - good.  Cont bari supplements  More than 50% of today's visit was spent in counseling the patient  Kaylee Roberts. Andrey Campanile, MD, FACS General, Bariatric, & Minimally Invasive Surgery [facility]  [place]   LOS: 4 days    Gaynelle AduWILSON,Sundi Slevin M 05/30/2016

## 2016-05-31 NOTE — Progress Notes (Signed)
PROGRESS NOTE                                                                                                                                                                                                             Patient Demographics:    Kaylee Roberts, is a 35 y.o. female, DOB - [birth year], RUE:[id number]  Admit date - 05/25/2016   Admitting Physician Tyrone Nine, MD  Outpatient Primary MD for the patient is Lemont Fillers., NP  LOS - 5  Outpatient Specialists: Gen Surgery Dr Daphine Deutscher.  Chief Complaint  Patient presents with  . Abdominal Pain  . Emesis  . Rectal Bleeding       Brief Narrative   35 y.o.  gastric bypass  presenting 05/25/16 for abdominal pain and rectal bleeding,  febrile 102.1, with mild leukocytosis, CT abdomen and pelvis with no acute findings,  Ultrasound significant for cholelithiasis, endoscopy and sigmoidoscopy 11/19 were unremarkable  Surgery consulted to evaluate for need of laparoscopic cholecystectomy secondary to biliary colic.   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   Fever   Nausea and vomiting   Rectal bleeding   Upper abdominal pain   Lower abdominal pain   Intractable vomiting with nausea  Abdominal pain, nausea vomiting with rectal bleed - GI input greatly appreciated , EGD and flexible sigmoidoscopy 11/19 unremarkable - Ultrasound significant for cholelithiasis, with slight bump in her LFTs, HIDA however neg - Given patient complains of biliary colic, discussed with general surgery to evaluate patient for need of laparoscopic cholecystectomy - patient for surgery 11/22  Hypokalemia - Repleted  GERD  - continue with PPI  Normocytic anemia - Continue with iron supplement , hemoglobin with slight trend downward  secondary to blood loss and delusional effect from IV fluids , remained stable    Code Status : Full  Family Communication  : none at bedside  Disposition Plan  : Home when stable.  Consults  :  Gen surgery,GI  Procedures  : EGD/sigmoidoscopy  DVT Prophylaxis  : SCDs , will start on subcutaneous heparin today given no GI bleed over last 48  hours and normal sigmoidoscopy  Lab Results  Component Value Date   PLT 189 05/31/2016    Antibiotics  :    Anti-infectives    Start     Dose/Rate Route Frequency Ordered Stop   05/26/16 1000  metroNIDAZOLE (FLAGYL) tablet 500 mg  Status:  Discontinued     500 mg Oral Every 8 hours 05/26/16 0801 05/27/16 1303   05/26/16 0900  ciprofloxacin (CIPRO) IVPB 400 mg  Status:  Discontinued     400 mg 200 mL/hr over 60 Minutes Intravenous Every 12 hours 05/26/16 0801 05/30/16 1222        Objective:   Vitals:   05/30/16 0537 05/30/16 1440 05/30/16 2141 05/31/16 0551  BP: 116/74 113/76 119/82 129/86  Pulse: 62 (!) 56 (!) 51 (!) 47  Resp: 16 17 18 18   Temp: 98.9 F (37.2 C) 97.9 F (36.6 C) 98.2 F (36.8 C) 97.6 F (36.4 C)  TempSrc: Oral Oral Oral Oral  SpO2: 98% 99% 99% 99%  Weight:      Height:        Wt Readings from Last 3 Encounters:  05/27/16 102.2 kg (225 lb 3.2 oz)  04/12/16 103.7 kg (228 lb 9.6 oz)  12/21/15 119.7 kg (264 lb)     Intake/Output Summary (Last 24 hours) at 05/31/16 1406 Last data filed at 05/31/16 1405  Gross per 24 hour  Intake             2284 ml  Output                0 ml  Net             2284 ml     Physical Exam  Awake Alert, Oriented X 3, No new F.N deficits, Normal affect [place].AT,PERRAL Supple Neck,No JVD, No cervical lymphadenopathy appriciated.  Symmetrical Chest wall movement, Good air movement bilaterally, CTAB RRR,No Gallops,Rubs or new Murmurs, No Parasternal Heave +ve B.Sounds, Abd Soft, RUQ tenderness , No rebound - guarding or rigidity. No Cyanosis, Clubbing or edema, No new Rash  or bruise      Data Review:    CBC  Recent Labs Lab 05/26/16 0419 05/27/16 0405 05/28/16 0404 05/29/16 0338 05/31/16 0409  WBC 4.8 4.4 5.1 5.9 7.7  HGB 10.7* 10.5* 10.2* 10.3* 10.6*  HCT 33.5* 33.2* 32.5* 32.5* 33.0*  PLT 198 186 154 171 189  MCV 83.3 84.5 84.0 83.8 83.1  MCH 26.6 26.7 26.4 26.5 26.7  MCHC 31.9 31.6 31.4 31.7 32.1  RDW 14.6 14.7 14.7 14.6 14.6    Chemistries   Recent Labs Lab 05/25/16 1151 05/25/16 2000 05/26/16 0419 05/27/16 0405 05/28/16 0404 05/29/16 0338 05/30/16 0352 05/31/16 0409  NA 136  --  137 138 140 140 139 140  K 3.4*  --  3.3* 3.6 3.4* 3.7 3.5 3.5  CL 103  --  107 107 108 108 107 107  CO2 23  --  24 25 23 26 27 26   GLUCOSE 100*  --  113* 98 86 89 90 89  BUN 16  --  11 7 5* 5* <5* 8  CREATININE 0.81  --  0.64 0.64 0.77 0.71 0.80 0.73  CALCIUM 9.4  --  8.5* 8.6* 8.5* 8.5* 8.5* 8.5*  MG  --  1.7  --   --   --   --   --   --   AST 18  --  21  --  47* 23 21  --  ALT 14  --  19  --  106* 72* 56*  --   ALKPHOS 71  --  57  --  93 82 73  --   BILITOT 1.2  --  1.2  --  0.8 0.9 1.0  --    ------------------------------------------------------------------------------------------------------------------ No results for input(s): CHOL, HDL, LDLCALC, TRIG, CHOLHDL, LDLDIRECT in the last 72 hours.  Lab Results  Component Value Date   HGBA1C 5.1 03/31/2015   ------------------------------------------------------------------------------------------------------------------ No results for input(s): TSH, T4TOTAL, T3FREE, THYROIDAB in the last 72 hours.  Invalid input(s): FREET3 ------------------------------------------------------------------------------------------------------------------ No results for input(s): VITAMINB12, FOLATE, FERRITIN, TIBC, IRON, RETICCTPCT in the last 72 hours.  Coagulation profile No results for input(s): INR, PROTIME in the last 168 hours.  No results for input(s): DDIMER in the last 72 hours.  Cardiac  Enzymes No results for input(s): CKMB, TROPONINI, MYOGLOBIN in the last 168 hours.  Invalid input(s): CK ------------------------------------------------------------------------------------------------------------------ No results found for: BNP  Inpatient Medications  Scheduled Meds: . famotidine (PEPCID) IV  20 mg Intravenous Q12H  . heparin subcutaneous  5,000 Units Subcutaneous Q8H  . multivitamin with minerals  1 tablet Oral Daily  . ondansetron (ZOFRAN) IV  8 mg Intravenous Q8H  . protein supplement shake  8 oz Oral TID BM   Continuous Infusions: . sodium chloride 0.9 % 1,000 mL with potassium chloride 10 mEq infusion 125 mL/hr at 05/31/16 0728   PRN Meds:.albuterol, oxyCODONE-acetaminophen, phenylephrine-shark liver oil-mineral oil-petrolatum, promethazine  Micro Results Recent Results (from the past 240 hour(s))  Blood culture (routine x 2)     Status: None   Collection Time: 05/25/16  1:20 PM  Result Value Ref Range Status   Specimen Description BLOOD LEFT ANTECUBITAL  Final   Special Requests BOTTLES DRAWN AEROBIC AND ANAEROBIC 5CC  Final   Culture   Final    NO GROWTH 5 DAYS Performed at [facility]    Report Status 05/30/2016 FINAL  Final  Blood culture (routine x 2)     Status: None   Collection Time: 05/25/16  1:26 PM  Result Value Ref Range Status   Specimen Description BLOOD BLOOD LEFT FOREARM  Final   Special Requests IN PEDIATRIC BOTTLE 4CC  Final   Culture   Final    NO GROWTH 5 DAYS Performed at [facility]    Report Status 05/30/2016 FINAL  Final  Wet prep, genital     Status: Abnormal   Collection Time: 05/25/16  4:50 PM  Result Value Ref Range Status   Yeast Wet Prep HPF POC NONE SEEN NONE SEEN Final   Trich, Wet Prep NONE SEEN NONE SEEN Final   Clue Cells Wet Prep HPF POC NONE SEEN NONE SEEN Final   WBC, Wet Prep HPF POC FEW (A) NONE SEEN Final   Sperm NONE SEEN  Final  Gastrointestinal Panel by PCR , Stool     Status: None    Collection Time: 05/26/16 10:32 AM  Result Value Ref Range Status   Campylobacter species NOT DETECTED NOT DETECTED Final   Plesimonas shigelloides NOT DETECTED NOT DETECTED Final   Salmonella species NOT DETECTED NOT DETECTED Final   Yersinia enterocolitica NOT DETECTED NOT DETECTED Final   Vibrio species NOT DETECTED NOT DETECTED Final   Vibrio cholerae NOT DETECTED NOT DETECTED Final   Enteroaggregative E coli (EAEC) NOT DETECTED NOT DETECTED Final   Enteropathogenic E coli (EPEC) NOT DETECTED NOT DETECTED Final   Enterotoxigenic E coli (ETEC) NOT DETECTED NOT DETECTED Final  Shiga like toxin producing E coli (STEC) NOT DETECTED NOT DETECTED Final   Shigella/Enteroinvasive E coli (EIEC) NOT DETECTED NOT DETECTED Final   Cryptosporidium NOT DETECTED NOT DETECTED Final   Cyclospora cayetanensis NOT DETECTED NOT DETECTED Final   Entamoeba histolytica NOT DETECTED NOT DETECTED Final   Giardia lamblia NOT DETECTED NOT DETECTED Final   Adenovirus F40/41 NOT DETECTED NOT DETECTED Final   Astrovirus NOT DETECTED NOT DETECTED Final   Norovirus GI/GII NOT DETECTED NOT DETECTED Final   Rotavirus A NOT DETECTED NOT DETECTED Final   Sapovirus (I, II, IV, and V) NOT DETECTED NOT DETECTED Final  Surgical pcr screen     Status: None   Collection Time: 05/31/16  7:42 AM  Result Value Ref Range Status   MRSA, PCR NEGATIVE NEGATIVE Final   Staphylococcus aureus NEGATIVE NEGATIVE Final    Comment:        The Xpert SA Assay (FDA approved for NASAL specimens in patients over 33 years of age), is one component of a comprehensive surveillance program.  Test performance has been validated by [facility] for patients greater than or equal to 33 year old. It is not intended to diagnose infection nor to guide or monitor treatment.     Radiology Reports Nm Hepatobiliary Including Gb  Result Date: 05/29/2016 CLINICAL DATA:  Upper abdominal pain with nausea and vomiting. EXAM: NUCLEAR MEDICINE  HEPATOBILIARY IMAGING TECHNIQUE: Sequential images of the abdomen were obtained out to 60 minutes following intravenous administration of radiopharmaceutical. RADIOPHARMACEUTICALS:  5.28 mCi Tc-2779m  Choletec IV COMPARISON:  Ultrasound of May 27, 2016. FINDINGS: Prompt uptake and biliary excretion of activity by the liver is seen. Gallbladder activity is visualized, consistent with patency of cystic duct. Biliary activity passes into small bowel, consistent with patent common bile duct. IMPRESSION: Normal filling of gallbladder is noted. There is no evidence of cystic duct obstruction to suggest cholecystitis. Electronically Signed   By: Lupita RaiderJames  Green Jr, M.D.   On: 05/29/2016 13:36   [place] Abdomen Complete  Result Date: 05/27/2016 CLINICAL DATA:  Upper abdominal pain, nausea, vomiting EXAM: ABDOMEN ULTRASOUND COMPLETE COMPARISON:  05/25/2016 FINDINGS: Gallbladder: Multiple small layering gallstones in the gallbladder, the largest 6 mm. No wall thickening. Negative sonographic Murphy's. Common bile duct: Diameter: Normal caliber, 6 mm Liver: No focal lesion identified. Within normal limits in parenchymal echogenicity. IVC: No abnormality visualized. Pancreas: Visualized portion unremarkable. Spleen: Enlarged with a craniocaudal length of 15.7 cm and a splenic volume of 1114 cubic cm. Right Kidney: Length: 11.4 cm. Echogenicity within normal limits. No mass or hydronephrosis visualized. Left Kidney: Length: 13.9 cm. Echogenicity within normal limits. No mass or hydronephrosis visualized. Abdominal aorta: No aneurysm visualized. Other findings: None. IMPRESSION: Cholelithiasis.  No sonographic evidence of acute cholecystitis. Splenomegaly. Electronically Signed   By: Charlett NoseKevin  Dover M.D.   On: 05/27/2016 15:35   Ct Abdomen Pelvis W Contrast  Result Date: 05/25/2016 CLINICAL DATA:  Left-sided abdominal and rectal pain EXAM: CT ABDOMEN AND PELVIS WITH CONTRAST TECHNIQUE: Multidetector CT imaging of the abdomen  and pelvis was performed using the standard protocol following bolus administration of intravenous contrast. CONTRAST:  100mL ISOVUE-300 IOPAMIDOL (ISOVUE-300) INJECTION 61% COMPARISON:  None. FINDINGS: Lower chest:  No contributory findings. Hepatobiliary: No focal liver abnormality.No evidence of biliary obstruction or stone. Pancreas: Unremarkable. Spleen: Splenomegaly and without focal mass. The spleen measures up to 15 cm, stable from 2016 sonography. No cirrhotic changes in the liver. Adrenals/Urinary Tract: Negative adrenals. No hydronephrosis or stone. Unremarkable bladder.  Stomach/Bowel: Gastric bypass without obstruction or marginal inflammation. No appendicitis. Vascular/Lymphatic: No acute vascular abnormality. No mass or adenopathy. Reproductive:No pathologic findings. Other: No ascites or pneumoperitoneum. Musculoskeletal: No acute or aggressive finding. Disc degeneration with narrowing and endplate spurring greatest at L5-S1. IMPRESSION: 1. No explanation for pain. 2. Splenomegaly that is stable from 2016 sonography Electronically Signed   By: Marnee Spring M.D.   On: 05/25/2016 14:55    Pleas Koch, MD [facility] [phone]

## 2016-05-31 NOTE — Anesthesia Postprocedure Evaluation (Signed)
Anesthesia Post Note  Patient: Kaylee Roberts  Procedure(s) Performed: Procedure(s) (LRB): LAPAROSCOPIC CHOLECYSTECTOMY (N/A)  Patient location during evaluation: PACU Anesthesia Type: General Level of consciousness: awake, awake and alert and oriented Pain management: pain level controlled Vital Signs Assessment: post-procedure vital signs reviewed and stable Respiratory status: spontaneous breathing and nonlabored ventilation Cardiovascular status: blood pressure returned to baseline Anesthetic complications: no    Last Vitals:  Vitals:   05/31/16 2015 05/31/16 2030  BP: 140/71 136/76  Pulse: 80 75  Resp: 19 16  Temp:      Last Pain:  Vitals:   05/31/16 2000  TempSrc:   PainSc: 9                  Danisa Kopec COKER

## 2016-05-31 NOTE — Progress Notes (Signed)
Progress Note   Subjective  Patient had trial of solid foods yesterday, did not tolerate it and had recurrent RUQ / epigastric pain. Seen by surgery and scheduled for lap chole today. She has been NPO since yesterday, feeling better but still has some RUQ / epigastric tenderness.    Objective   Vital signs in last 24 hours: Temp:  [97.6 F (36.4 C)-98.2 F (36.8 C)] 97.6 F (36.4 C) (11/22 0551) Pulse Rate:  [47-56] 47 (11/22 0551) Resp:  [17-18] 18 (11/22 0551) BP: (113-129)/(76-86) 129/86 (11/22 0551) SpO2:  [99 %] 99 % (11/22 0551) Last BM Date: 05/30/16 General:    white female in NAD Heart:  Regular rate and rhythm; no murmurs Lungs: Respirations even and unlabored, lungs CTA bilaterally Abdomen:  Soft, mild RUQ and epigastric TTP, and nondistended. Normal bowel sounds. Extremities:  Without edema. Neurologic:  Alert and oriented,  grossly normal neurologically. Psych:  Cooperative. Normal mood and affect.  Intake/Output from previous day: 11/21 0701 - 11/22 0700 In: 2190.8 [P.O.:450; I.V.:1540.8; IV Piggyback:200] Out: -  Intake/Output this shift: No intake/output data recorded.  Lab Results:  Recent Labs  05/29/16 0338 05/31/16 0409  WBC 5.9 7.7  HGB 10.3* 10.6*  HCT 32.5* 33.0*  PLT 171 189   BMET  Recent Labs  05/29/16 0338 05/30/16 0352 05/31/16 0409  NA 140 139 140  K 3.7 3.5 3.5  CL 108 107 107  CO2 26 27 26   GLUCOSE 89 90 89  BUN 5* <5* 8  CREATININE 0.71 0.80 0.73  CALCIUM 8.5* 8.5* 8.5*   LFT  Recent Labs  05/30/16 0352  PROT 5.8*  ALBUMIN 3.0*  AST 21  ALT 56*  ALKPHOS 73  BILITOT 1.0   PT/INR No results for input(s): LABPROT, INR in the last 72 hours.  Studies/Results: Nm Hepatobiliary Including Gb  Result Date: 05/29/2016 CLINICAL DATA:  Upper abdominal pain with nausea and vomiting. EXAM: NUCLEAR MEDICINE HEPATOBILIARY IMAGING TECHNIQUE: Sequential images of the abdomen were obtained out to 60 minutes following  intravenous administration of radiopharmaceutical. RADIOPHARMACEUTICALS:  5.28 mCi Tc-6513m  Choletec IV COMPARISON:  Ultrasound of May 27, 2016. FINDINGS: Prompt uptake and biliary excretion of activity by the liver is seen. Gallbladder activity is visualized, consistent with patency of cystic duct. Biliary activity passes into small bowel, consistent with patent common bile duct. IMPRESSION: Normal filling of gallbladder is noted. There is no evidence of cystic duct obstruction to suggest cholecystitis. Electronically Signed   By: Lupita RaiderJames  Green Jr, M.D.   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 Workup to date has included CT scan, US, HIDA scan, EGD, and flex sig. Splenomegaly noted which appears stable / chronic, otherwise the only remarkable finding is gallstones noted in GB without cholecystitis, no biliary ductal dilation. HIDA negative. Endoscopies unremarkable with exception of hemorrhoids which may the source of her scant rectal bleeding. Mild ALT elevation noted which has since downtrended, AP and bili normal. Viral labs negative.   Since admission her symptoms have evolved (pain from LLQ initially to now RUQ) and now mainly postprandial epigastric / RUQ pain, this is more c/w biliary colic. She failed trial of advancing her diet yesterday, surgery consulted and patient scheduled for lap chole and they will ensure no evidence of  internal hernia. We will see how she responds to her surgery, hopefully it resolves her pain. Please call with any questions, we will reassess her tomorrow.  Ileene PatrickSteven Amarra Sawyer, MD [facility] Pager [phone]

## 2016-06-01 DIAGNOSIS — K625 Hemorrhage of anus and rectum: Secondary | ICD-10-CM

## 2016-06-01 DIAGNOSIS — K8050 Calculus of bile duct without cholangitis or cholecystitis without obstruction: Secondary | ICD-10-CM

## 2016-06-01 DIAGNOSIS — K8010 Calculus of gallbladder with chronic cholecystitis without obstruction: Secondary | ICD-10-CM

## 2016-06-01 LAB — CBC WITH DIFFERENTIAL/PLATELET
BASOS ABS: 0 10*3/uL (ref 0.0–0.1)
Basophils Relative: 0 %
EOS PCT: 0 %
Eosinophils Absolute: 0 10*3/uL (ref 0.0–0.7)
HEMATOCRIT: 36.2 % (ref 36.0–46.0)
Hemoglobin: 11.7 g/dL — ABNORMAL LOW (ref 12.0–15.0)
LYMPHS ABS: 0.7 10*3/uL (ref 0.7–4.0)
LYMPHS PCT: 8 %
MCH: 26.4 pg (ref 26.0–34.0)
MCHC: 32.3 g/dL (ref 30.0–36.0)
MCV: 81.7 fL (ref 78.0–100.0)
MONO ABS: 0.3 10*3/uL (ref 0.1–1.0)
MONOS PCT: 4 %
NEUTROS ABS: 7.7 10*3/uL (ref 1.7–7.7)
Neutrophils Relative %: 88 %
PLATELETS: 246 10*3/uL (ref 150–400)
RBC: 4.43 MIL/uL (ref 3.87–5.11)
RDW: 14.2 % (ref 11.5–15.5)
WBC: 8.8 10*3/uL (ref 4.0–10.5)

## 2016-06-01 LAB — COMPREHENSIVE METABOLIC PANEL
ALT: 55 U/L — ABNORMAL HIGH (ref 14–54)
ANION GAP: 9 (ref 5–15)
AST: 43 U/L — AB (ref 15–41)
Albumin: 3.9 g/dL (ref 3.5–5.0)
Alkaline Phosphatase: 92 U/L (ref 38–126)
BILIRUBIN TOTAL: 0.9 mg/dL (ref 0.3–1.2)
BUN: 7 mg/dL (ref 6–20)
CHLORIDE: 103 mmol/L (ref 101–111)
CO2: 24 mmol/L (ref 22–32)
Calcium: 9 mg/dL (ref 8.9–10.3)
Creatinine, Ser: 0.65 mg/dL (ref 0.44–1.00)
Glucose, Bld: 117 mg/dL — ABNORMAL HIGH (ref 65–99)
POTASSIUM: 4.2 mmol/L (ref 3.5–5.1)
Sodium: 136 mmol/L (ref 135–145)
TOTAL PROTEIN: 7 g/dL (ref 6.5–8.1)

## 2016-06-01 MED ORDER — PHENYLEPH-SHARK LIV OIL-MO-PET 0.25-3-14-71.9 % RE OINT: TOPICAL_OINTMENT | Freq: Two times a day (BID) | RECTAL | 0 refills | Status: AC | PRN

## 2016-06-01 MED ORDER — OXYCODONE-ACETAMINOPHEN 5-325 MG PO TABS
1.0000 | ORAL_TABLET | ORAL | 0 refills | Status: AC | PRN
Start: 2016-06-01 — End: ?

## 2016-06-01 NOTE — Progress Notes (Signed)
      Progress Note   Subjective  Patient feels well today. S/p cholecystectomy, pain resolved, eating well. Very happy this AM.    Objective   Vital signs in last 24 hours: Temp:  [98 F (36.7 C)-98.7 F (37.1 C)] 98.5 F (36.9 C) (11/23 0900) Pulse Rate:  [63-92] 69 (11/23 0900) Resp:  [14-22] 16 (11/23 0900) BP: (116-143)/(62-84) 117/67 (11/23 0900) SpO2:  [97 %-100 %] 100 % (11/23 0900) Last BM Date: 05/30/16 (Preop) General:    white female in NAD Heart:  Regular rate and rhythm; no murmurs Lungs: Respirations even and unlabored, lungs CTA bilaterally Abdomen:  Soft, appropriate mild tenderness at surgical incisions, and nondistended.  Extremities:  Without edema. Neurologic:  Alert and oriented,  grossly normal neurologically. Psych:  Cooperative. Normal mood and affect.  Intake/Output from previous day: 11/22 0701 - 11/23 0700 In: 2890.3 [I.V.:2786.3; IV Piggyback:104] Out: 1935 [Urine:1900; Blood:35] Intake/Output this shift: Total I/O In: 1387.1 [P.O.:360; I.V.:1027.1] Out: 1200 [Urine:1200]  Lab Results:  Recent Labs  05/31/16 0409 06/01/16 0507  WBC 7.7 8.8  HGB 10.6* 11.7*  HCT 33.0* 36.2  PLT 189 246   BMET  Recent Labs  05/30/16 0352 05/31/16 0409 06/01/16 0507  NA 139 140 136  K 3.5 3.5 4.2  CL 107 107 103  CO2 27 26 24   GLUCOSE 90 89 117*  BUN <5* 8 7  CREATININE 0.80 0.73 0.65  CALCIUM 8.5* 8.5* 9.0   LFT  Recent Labs  06/01/16 0507  PROT 7.0  ALBUMIN 3.9  AST 43*  ALT 55*  ALKPHOS 92  BILITOT 0.9   PT/INR No results for input(s): LABPROT, INR in the last 72 hours.  Studies/Results: No results found.     Assessment / Plan:   35 y/o female who presented with a nausea / vomiting / fever / abdominal pain / rectal bleeding, underwent a variety of testing, see prior note for details. Over time symptoms changed and more c/w biliary colic. She underwent cholecystectomy with Dr. Andrey CampanileWilson yesterday, she reports feeling  significantly improved today, no further pain. Cleared for discharge today per surgery, she can follow up as needed with us moving forward. Mild ALT elevation noted, likely related to GB, but may repeat in a month, if persists, consider further workup.   Ileene PatrickSteven Janis Cuffe, MD [facility] Pager [phone]

## 2016-06-01 NOTE — Discharge Summary (Signed)
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  gastric bypass  presenting 05/25/16 for abdominal pain and rectal bleeding,  febrile 102.1, with mild leukocytosis, CT abdomen and pelvis with no acute findings,  Ultrasound significant for cholelithiasis, endoscopy and sigmoidoscopy 11/19 were unremarkable  Surgery consulted to evaluate for need of laparoscopic cholecystectomy secondary to biliary colic. Had HIDA scan which showed cholelithiasis and eventually underwent lap chole on 05/31/16 Was started on po iron supplements on d/c and started also  Discharge Exam: Vitals:   06/01/16 0454 06/01/16 0900  BP: 120/62 117/67  Pulse: 67 69  Resp: 18 16  Temp: 98.6 F (37 C) 98.5 F (36.9 C)    General: eomi ncat Cardiovascular: s1 s 2no m/r/g Respiratory: clear no added sound abd soft Surgical scars are +    Discharge Instructions   Discharge Instructions    Diet - low sodium heart healthy    Complete by:  As directed    Discharge instructions    Complete by:  As directed    Please follow post-op  instructions as per Gen surgery and follow up as op with them Diet as per gen surgeon Get rpt cbc and other labs as OP   Increase activity slowly    Complete by:  As directed      Current Discharge Medication List    START taking these medications   Details  oxyCODONE-acetaminophen (PERCOCET/ROXICET) 5-325 MG tablet Take 1-2 tablets by mouth every 4 (four) hours as needed for moderate pain or severe pain. Qty: 10 tablet, Refills: 0    phenylephrine-shark liver oil-mineral oil-petrolatum (PREPARATION H) 0.25-3-14-71.9 % rectal ointment Place rectally 2 (two) times daily as needed for hemorrhoids. Qty: 30 g, Refills: 0      CONTINUE these medications which have NOT CHANGED   Details  albuterol (PROVENTIL HFA;VENTOLIN HFA) 108 (90 BASE) MCG/ACT inhaler Inhale 2 puffs into the lungs every 6 (six) hours as needed for wheezing or shortness of breath. Qty: 1 Inhaler, Refills: 2    Biotin 1 MG CAPS Take 1 mg by mouth daily.    ferrous sulfate 325 (65 FE) MG tablet Take 325 mg by mouth daily with breakfast.    Multiple Vitamin (MULTIVITAMIN WITH MINERALS) TABS tablet Take 1 tablet by mouth daily.    NIACINAMIDE-ZINC-COPPER-FA PO Take 1 tablet by mouth daily.    oxymetazoline (AFRIN) 0.05 % nasal spray Place 1 spray into both nostrils 2 (two) times daily as needed for congestion.    pantoprazole (PROTONIX) 40 MG tablet Take 40 mg by mouth daily.    pseudoephedrine (SUDAFED) 120  MG 12 hr tablet Take 120 mg by mouth every 12 (twelve) hours as needed for congestion.       Allergies  Allergen Reactions  . Nsaids Other (See Comments)    Per pts MD she is unable to take this class of medications.    Joyce Copa. Allegra [Fexofenadine] Palpitations and Rash  . Claritin [Loratadine] Palpitations and Rash  . Tetracyclines & Related Palpitations and Rash   Follow-up Information    MARTIN,MATTHEW B, MD. Schedule an appointment as soon as possible for a visit in 3 week(s).   Specialty:  General  Surgery Why:  long term follow up for gastric bypass by dr Daphine Deutschermartin and gallbladder surgery by Dr Andrey CampanileWilson 11/22 Contact information: [address] [place] [address] [phone]            The results of significant diagnostics from this hospitalization (including imaging, microbiology, ancillary and laboratory) are listed below for reference.    Significant Diagnostic Studies: Nm Hepatobiliary Including Gb  Result Date: 05/29/2016 CLINICAL DATA:  Upper abdominal pain with nausea and vomiting. EXAM: NUCLEAR MEDICINE HEPATOBILIARY IMAGING TECHNIQUE: Sequential images of the abdomen were obtained out to 60 minutes following intravenous administration of radiopharmaceutical. RADIOPHARMACEUTICALS:  5.28 mCi Tc-6461m  Choletec IV COMPARISON:  Ultrasound of May 27, 2016. FINDINGS: Prompt uptake and biliary excretion of activity by the liver is seen. Gallbladder activity is visualized, consistent with patency of cystic duct. Biliary activity passes into small bowel, consistent with patent common bile duct. IMPRESSION: Normal filling of gallbladder is noted. There is no evidence of cystic duct obstruction to suggest cholecystitis. Electronically Signed   By: Lupita RaiderJames  Green Jr, M.D.   On: 05/29/2016 13:36   [place] Abdomen Complete  Result Date: 05/27/2016 CLINICAL DATA:  Upper abdominal pain, nausea, vomiting EXAM: ABDOMEN ULTRASOUND COMPLETE COMPARISON:  05/25/2016 FINDINGS: Gallbladder: Multiple small layering gallstones in the gallbladder, the largest 6 mm. No wall thickening. Negative sonographic Murphy's. Common bile duct: Diameter: Normal caliber, 6 mm Liver: No focal lesion identified. Within normal limits in parenchymal echogenicity. IVC: No abnormality visualized. Pancreas: Visualized portion unremarkable. Spleen: Enlarged with a craniocaudal length of 15.7 cm and a splenic volume of 1114 cubic cm. Right Kidney: Length: 11.4 cm. Echogenicity within normal limits. No mass or  hydronephrosis visualized. Left Kidney: Length: 13.9 cm. Echogenicity within normal limits. No mass or hydronephrosis visualized. Abdominal aorta: No aneurysm visualized. Other findings: None. IMPRESSION: Cholelithiasis.  No sonographic evidence of acute cholecystitis. Splenomegaly. Electronically Signed   By: Charlett NoseKevin  Dover M.D.   On: 05/27/2016 15:35   Ct Abdomen Pelvis W Contrast  Result Date: 05/25/2016 CLINICAL DATA:  Left-sided abdominal and rectal pain EXAM: CT ABDOMEN AND PELVIS WITH CONTRAST TECHNIQUE: Multidetector CT imaging of the abdomen and pelvis was performed using the standard protocol following bolus administration of intravenous contrast. CONTRAST:  100mL ISOVUE-300 IOPAMIDOL (ISOVUE-300) INJECTION 61% COMPARISON:  None. FINDINGS: Lower chest:  No contributory findings. Hepatobiliary: No focal liver abnormality.No evidence of biliary obstruction or stone. Pancreas: Unremarkable. Spleen: Splenomegaly and without focal mass. The spleen measures up to 15 cm, stable from 2016 sonography. No cirrhotic changes in the liver. Adrenals/Urinary Tract: Negative adrenals. No hydronephrosis or stone. Unremarkable bladder. Stomach/Bowel: Gastric bypass without obstruction or marginal inflammation. No appendicitis. Vascular/Lymphatic: No acute vascular abnormality. No mass or adenopathy. Reproductive:No pathologic findings. Other: No ascites or pneumoperitoneum. Musculoskeletal: No acute or aggressive finding. Disc degeneration with narrowing and endplate spurring greatest at L5-S1. IMPRESSION: 1. No explanation for pain.  2. Splenomegaly that is stable from 2016 sonography Electronically Signed   By: Marnee Spring M.D.   On: 05/25/2016 14:55    Microbiology: Recent Results (from the past 240 hour(s))  Blood culture (routine x 2)     Status: None   Collection Time: 05/25/16  1:20 PM  Result Value Ref Range Status   Specimen Description BLOOD LEFT ANTECUBITAL  Final   Special Requests BOTTLES DRAWN  AEROBIC AND ANAEROBIC 5CC  Final   Culture   Final    NO GROWTH 5 DAYS Performed at [facility]    Report Status 05/30/2016 FINAL  Final  Blood culture (routine x 2)     Status: None   Collection Time: 05/25/16  1:26 PM  Result Value Ref Range Status   Specimen Description BLOOD BLOOD LEFT FOREARM  Final   Special Requests IN PEDIATRIC BOTTLE 4CC  Final   Culture   Final    NO GROWTH 5 DAYS Performed at [facility]    Report Status 05/30/2016 FINAL  Final  Wet prep, genital     Status: Abnormal   Collection Time: 05/25/16  4:50 PM  Result Value Ref Range Status   Yeast Wet Prep HPF POC NONE SEEN NONE SEEN Final   Trich, Wet Prep NONE SEEN NONE SEEN Final   Clue Cells Wet Prep HPF POC NONE SEEN NONE SEEN Final   WBC, Wet Prep HPF POC FEW (A) NONE SEEN Final   Sperm NONE SEEN  Final  Gastrointestinal Panel by PCR , Stool     Status: None   Collection Time: 05/26/16 10:32 AM  Result Value Ref Range Status   Campylobacter species NOT DETECTED NOT DETECTED Final   Plesimonas shigelloides NOT DETECTED NOT DETECTED Final   Salmonella species NOT DETECTED NOT DETECTED Final   Yersinia enterocolitica NOT DETECTED NOT DETECTED Final   Vibrio species NOT DETECTED NOT DETECTED Final   Vibrio cholerae NOT DETECTED NOT DETECTED Final   Enteroaggregative E coli (EAEC) NOT DETECTED NOT DETECTED Final   Enteropathogenic E coli (EPEC) NOT DETECTED NOT DETECTED Final   Enterotoxigenic E coli (ETEC) NOT DETECTED NOT DETECTED Final   Shiga like toxin producing E coli (STEC) NOT DETECTED NOT DETECTED Final   Shigella/Enteroinvasive E coli (EIEC) NOT DETECTED NOT DETECTED Final   Cryptosporidium NOT DETECTED NOT DETECTED Final   Cyclospora cayetanensis NOT DETECTED NOT DETECTED Final   Entamoeba histolytica NOT DETECTED NOT DETECTED Final   Giardia lamblia NOT DETECTED NOT DETECTED Final   Adenovirus F40/41 NOT DETECTED NOT DETECTED Final   Astrovirus NOT DETECTED NOT DETECTED  Final   Norovirus GI/GII NOT DETECTED NOT DETECTED Final   Rotavirus A NOT DETECTED NOT DETECTED Final   Sapovirus (I, II, IV, and V) NOT DETECTED NOT DETECTED Final  Surgical pcr screen     Status: None   Collection Time: 05/31/16  7:42 AM  Result Value Ref Range Status   MRSA, PCR NEGATIVE NEGATIVE Final   Staphylococcus aureus NEGATIVE NEGATIVE Final    Comment:        The Xpert SA Assay (FDA approved for NASAL specimens in patients over 24 years of age), is one component of a comprehensive surveillance program.  Test performance has been validated by [facility] for patients greater than or equal to 102 year old. It is not intended to diagnose infection nor to guide or monitor treatment.      Labs: Basic Metabolic Panel:  Recent Labs Lab 05/25/16  2000  05/28/16 0404 05/29/16 0338 05/30/16 0352 05/31/16 0409 06/01/16 0507  NA  --   < > 140 140 139 140 136  K  --   < > 3.4* 3.7 3.5 3.5 4.2  CL  --   < > 108 108 107 107 103  CO2  --   < > 23 26 27 26 24   GLUCOSE  --   < > 86 89 90 89 117*  BUN  --   < > 5* 5* <5* 8 7  CREATININE  --   < > 0.77 0.71 0.80 0.73 0.65  CALCIUM  --   < > 8.5* 8.5* 8.5* 8.5* 9.0  MG 1.7  --   --   --   --   --   --   PHOS 3.9  --   --   --   --   --   --   < > = values in this interval not displayed. Liver Function Tests:  Recent Labs Lab 05/26/16 0419 05/28/16 0404 05/29/16 0338 05/30/16 0352 06/01/16 0507  AST 21 47* 23 21 43*  ALT 19 106* 72* 56* 55*  ALKPHOS 57 93 82 73 92  BILITOT 1.2 0.8 0.9 1.0 0.9  PROT 6.1* 5.9* 5.7* 5.8* 7.0  ALBUMIN 3.7 3.1* 3.2* 3.0* 3.9    Recent Labs Lab 05/30/16 0352  LIPASE 18   No results for input(s): AMMONIA in the last 168 hours. CBC:  Recent Labs Lab 05/27/16 0405 05/28/16 0404 05/29/16 0338 05/31/16 0409 06/01/16 0507  WBC 4.4 5.1 5.9 7.7 8.8  NEUTROABS  --   --   --   --  7.7  HGB 10.5* 10.2* 10.3* 10.6* 11.7*  HCT 33.2* 32.5* 32.5* 33.0* 36.2  MCV 84.5 84.0 83.8 83.1  81.7  PLT 186 154 171 189 246   Cardiac Enzymes: No results for input(s): CKTOTAL, CKMB, CKMBINDEX, TROPONINI in the last 168 hours. BNP: BNP (last 3 results) No results for input(s): BNP in the last 8760 hours.  ProBNP (last 3 results) No results for input(s): PROBNP in the last 8760 hours.  CBG:  Recent Labs Lab 05/25/16 1244  GLUCAP 98       Signed:  Rhetta Mura MD   [facility] 06/01/2016, 11:15 AM

## 2016-06-01 NOTE — Progress Notes (Signed)
Discharge instructions and prescriptions given to patient .  Questions answered 

## 2016-06-01 NOTE — Discharge Instructions (Signed)
[facility] LAPAROSCOPIC SURGERY: POST OP INSTRUCTIONS Always review your discharge instruction sheet given to you by the facility where your surgery was performed. IF YOU HAVE DISABILITY OR FAMILY LEAVE FORMS, YOU MUST BRING THEM TO THE OFFICE FOR PROCESSING.   DO NOT GIVE THEM TO YOUR DOCTOR.  1. A prescription for pain medication may be given to you upon discharge.  Take your pain medication as prescribed, if needed.  If narcotic pain medicine is not needed, then you may take acetaminophen (Tylenol) as needed. 2. Take your usually prescribed medications unless otherwise directed. 3. If you need a refill on your pain medication, please contact your pharmacy.  They will contact our office to request authorization. Prescriptions will not be filled after 5pm or on week-ends. 4. You should follow a light diet the first few days after arrival home, such as soup and crackers, etc.  Be sure to include lots of fluids daily. 5. Most patients will experience some swelling and bruising in the area of the incisions.  Ice packs will help.  Swelling and bruising can take several days to resolve.  6. It is common to experience some constipation if taking pain medication after surgery.  Increasing fluid intake and taking a stool softener (such as Colace) will usually help or prevent this problem from occurring.  A mild laxative (Milk of Magnesia or Miralax) should be taken according to package instructions if there are no bowel movements after 48 hours. 7. Unless discharge instructions indicate otherwise, you may remove your bandages 48 hours after surgery, and you may shower at that time.  You  have steri-strips (small skin tapes) in place directly over the incision.  These strips should be left on the skin for 7-10 days.  8. ACTIVITIES:  You may resume regular (light) daily activities beginning the next day--such as daily self-care, walking, climbing stairs--gradually increasing activities as  tolerated.  You may have sexual intercourse when it is comfortable.  Refrain from any heavy lifting or straining until approved by your doctor. a. You may drive when you are no longer taking prescription pain medication, you can comfortably wear a seatbelt, and you can safely maneuver your car and apply brakes. 9. You should see your doctor in the office for a follow-up appointment approximately 2-3 weeks after your surgery.  Make sure that you call for this appointment within a day or two after you arrive home to insure a convenient appointment time. 10. OTHER INSTRUCTIONS: TAKE BARIATRIC SUPPLEMENTS AS DIRECTED; AVOID NSAIDS (MOTRIN/IBUPROFEN/ALEVE,ETC)  WHEN TO CALL YOUR DOCTOR: 1. Fever over 101.0 2. Inability to urinate 3. Continued bleeding from incision. 4. Increased pain, redness, or drainage from the incision. 5. Increasing abdominal pain  The clinic staff is available to answer your questions during regular business hours.  Please dont hesitate to call and ask to speak to one of the nurses for clinical concerns.  If you have a medical emergency, go to the nearest emergency room or call 911.  A surgeon from [facility] is always on call at the hospital. [address], [address], [place], [place]  [id number] ? [address] [address], [place]   [address] [phone] ? [phone] ? FAX [phone] Web site: [email or web address]

## 2016-06-01 NOTE — Progress Notes (Signed)
1 Day Post-Op  Subjective: "Feels so much better"; tolerated diet. Just sore around belly button and a little "gas bubble" in Rt shoulder  Objective: Vital signs in last 24 hours: Temp:  [98 F (36.7 C)-98.7 F (37.1 C)] 98.5 F (36.9 C) (11/23 0900) Pulse Rate:  [63-92] 69 (11/23 0900) Resp:  [14-22] 16 (11/23 0900) BP: (116-143)/(62-84) 117/67 (11/23 0900) SpO2:  [97 %-100 %] 100 % (11/23 0900) Last BM Date: 05/30/16 (Preop)  Intake/Output from previous day: 11/22 0701 - 11/23 0700 In: 2890.3 [I.V.:2786.3; IV Piggyback:104] Out: 1935 [Urine:1900; Blood:35] Intake/Output this shift: Total I/O In: 1387.1 [P.O.:360; I.V.:1027.1] Out: 1200 [Urine:1200]  Alert, resting comfortably; [facility], incisions c/d/i; mild approp TTP  Lab Results:   Recent Labs  05/31/16 0409 06/01/16 0507  WBC 7.7 8.8  HGB 10.6* 11.7*  HCT 33.0* 36.2  PLT 189 246   BMET  Recent Labs  05/31/16 0409 06/01/16 0507  NA 140 136  K 3.5 4.2  CL 107 103  CO2 26 24  GLUCOSE 89 117*  BUN 8 7  CREATININE 0.73 0.65  CALCIUM 8.5* 9.0   PT/INR No results for input(s): LABPROT, INR in the last 72 hours. ABG No results for input(s): PHART, HCO3 in the last 72 hours.  Invalid input(s): PCO2, PO2  Studies/Results: No results found.  Anti-infectives: Anti-infectives    Start     Dose/Rate Route Frequency Ordered Stop   05/26/16 1000  metroNIDAZOLE (FLAGYL) tablet 500 mg  Status:  Discontinued     500 mg Oral Every 8 hours 05/26/16 0801 05/27/16 1303   05/26/16 0900  ciprofloxacin (CIPRO) IVPB 400 mg  Status:  Discontinued     400 mg 200 mL/hr over 60 Minutes Intravenous Every 12 hours 05/26/16 0801 05/30/16 1222      Assessment/Plan: s/p Procedure(s): LAPAROSCOPIC CHOLECYSTECTOMY (N/A) H/o lap roux en y gastric bypass  Can be dc from our perspective.  F/u with Dr Daphine DeutscherMartin Discussed dc instructions.   Mary SellaEric M. Andrey CampanileWilson, MD, FACS General, Bariatric, & Minimally Invasive  Surgery [facility] [place]   LOS: 6 days    Atilano InaWILSON,Jeriel Vivanco M 06/01/2016

## 2016-06-05 ENCOUNTER — Encounter (HOSPITAL_COMMUNITY): Payer: Self-pay | Admitting: General Surgery

## 2016-06-23 ENCOUNTER — Encounter: Payer: Self-pay | Admitting: *Deleted

## 2016-07-12 ENCOUNTER — Ambulatory Visit: Payer: 59 | Admitting: Dietician

## 2016-07-13 ENCOUNTER — Encounter: Payer: 59 | Attending: Surgery | Admitting: Dietician

## 2016-07-13 DIAGNOSIS — Z713 Dietary counseling and surveillance: Secondary | ICD-10-CM | POA: Insufficient documentation

## 2016-07-13 DIAGNOSIS — Z6841 Body Mass Index (BMI) 40.0 and over, adult: Secondary | ICD-10-CM | POA: Insufficient documentation

## 2016-07-13 NOTE — Patient Instructions (Addendum)
  Habits to keep/reestablish: eating breakfast, workout routine, drinking more water, avoiding sodas, smaller portions and protein shakes, mindful eating, conscientious of nutritious options, asking for a box immediately at a restaurant, eating slowly and taking time to register fullness  On the cruise: -Only eat from a plate -Pay attention to your hunger cues -Work on not feeling guilty when you indulge in a treat -Stay active! Swimming, walking, and other activities  Think about seeing a counselor    Surgery date: 10/26/2015 Surgery type: RYGB [facility]: 303 lbs on 02/09/2015, 305.5 lbs on 10/04/15 Weight today: 213.4 lbs Weight change: 15.2 lbs Total weight loss: 92 lbs  TANITA  BODY COMP RESULTS  10/04/15 11/09/15 12/21/15 02/22/16 04/11/16 07/13/16   BMI (kg/m^2) 47.8 44.6 41.3 38.1 35.8 33.4   Fat Mass (lbs) 159 141.5 131.2 109.4 97.6 81.8   Fat Free Mass (lbs) 146.5 143.0 132.8 133.5 131 131.6   Total Body Water (lbs) 107 104.5 98.4 98.2 95.6 95.6

## 2016-07-13 NOTE — Progress Notes (Signed)
  Follow-up visit:  8.5 months Post-Operative RYGB Surgery  Medical Nutrition Therapy:  Appt start time: 210 end time: 300  Primary concerns today: Post-operative Bariatric Surgery Nutrition Management.  Kaylee Roberts returns today having lost another 15 pounds. She reports that she would like to reach about 195 lbs. Was hospitalized for a week in November before receiving a cholecystectomy. Recently started going back to the gym. Concerned that she can eat foods that are not recommended. Still considering trying to conceive at a year post op. Going on a cruise in February.   Goal: another 20-lb loss by April  Surgery date: 10/26/2015 Surgery type: RYGB [facility]: 303 lbs on 02/09/2015, 305.5 lbs on 10/04/15 Weight today: 213.4 lbs Weight change: 15.2 lbs Total weight loss: 92 lbs   TANITA  BODY COMP RESULTS  10/04/15 11/09/15 12/21/15 02/22/16 04/11/16 07/13/16   BMI (kg/m^2) 47.8 44.6 41.3 38.1 35.8 33.4   Fat Mass (lbs) 159 141.5 131.2 109.4 97.6 81.8   Fat Free Mass (lbs) 146.5 143.0 132.8 133.5 131 131.6   Total Body Water (lbs) 107 104.5 98.4 98.2 95.6 95.6    Preferred Learning Style:   No preference indicated   Learning Readiness:   Ready  24-hr recall: B (AM): Atkins bar (12g) Gym 5-6x a week Snk (AM):  Premier protein shake (30g) L (PM): 2-3 oz grilled chicken on salad (14-21g) Snk (PM):  D (PM): 2-3 oz beef with vegetables (14-21g) Snk (PM): sugar free popsicle or jello with sugar free cool whip, sometimes beef jerky  Fluid intake: water, decaf unsweet tea with Truvia, crystal light, decaf coffee (64+ oz most days) Estimated total protein intake: 60+ grams per day per patient; has a Premier protein shake on days she exercises  Medications: see list  Supplementation: taking, also taking copper and zinc  Using straws: yes, feels like she has more gas when she does not use straws Drinking while eating: no Hair loss: yes, taking Biotin Carbonated beverages:  none N/V/D/C: none Dumping syndrome: 1x with Halo Top ice cream  Recent physical activity:  5-6x a week (deep water aerobics, Body Pump, HIT/LIT training, Barre)  Progress Towards Goal(s):  In progress.  Handouts given during visit include:  none   Nutritional Diagnosis:  [place]-3.3 Overweight/obesity related to past poor dietary habits and physical inactivity as evidenced by patient w/ recent RYGB surgery following dietary guidelines for continued weight loss.     Intervention:  Nutrition counseling provided.  Teaching Method Utilized:  Visual Auditory Hands on  Barriers to learning/adherence to lifestyle change: none  Demonstrated degree of understanding via:  Teach Back   Monitoring/Evaluation:  Dietary intake, exercise, and body weight. Follow up in 6 weeks.

## 2016-07-14 ENCOUNTER — Encounter: Payer: Self-pay | Admitting: Dietician

## 2016-08-23 ENCOUNTER — Ambulatory Visit: Payer: 59 | Admitting: Dietician

## 2016-09-07 IMAGING — RF DG UGI W/ GASTROGRAFIN
8 of 10 series · 12 of 23 positions shown · IV contrast (omnipaque)
Comparison: None.

CLINICAL DATA: Status post gastric bypass surgery.

EXAM:
WATER SOLUBLE UPPER GI SERIES
TECHNIQUE: Single-column upper GI series was performed using water soluble
contrast.
CONTRAST:  30 cc of Omnipaque

[Series 1: t abdomen supine · 0.15mm/px · 1 of 1 slices shown]
[im 1/1]
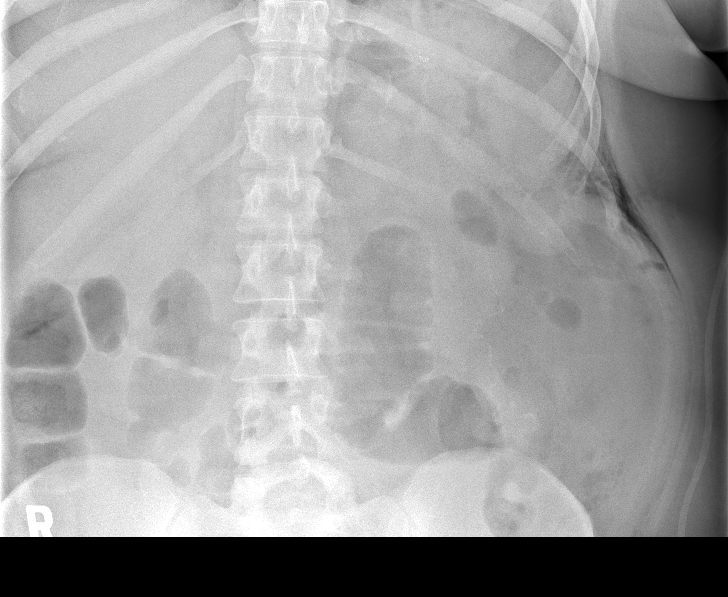

[Series 2: cp_standard · 0.51mm/px · 2 of 64 frames shown]
[frame 33/64]
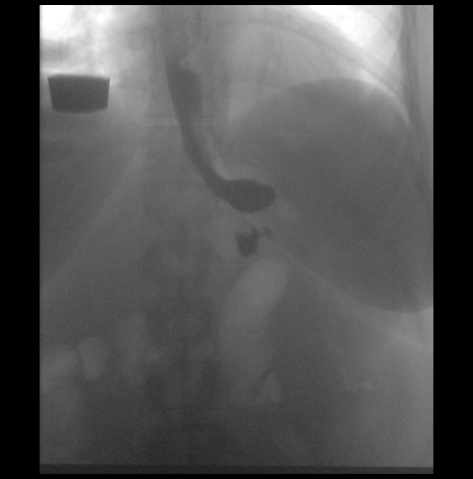
[frame 60/64]
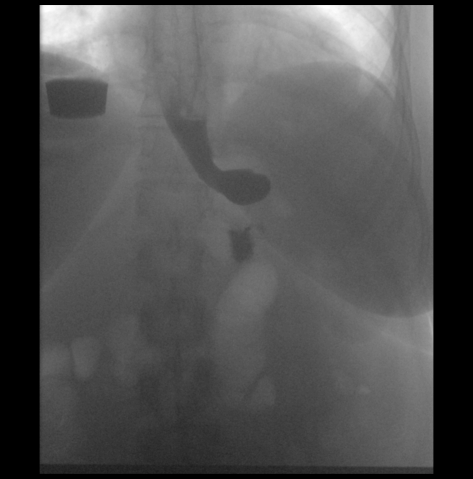

[Series 4: fluoro_barium 2fps_bw · 0.17mm/px · 1 of 2 frames shown (1 of 6)]
[frame 1/2]
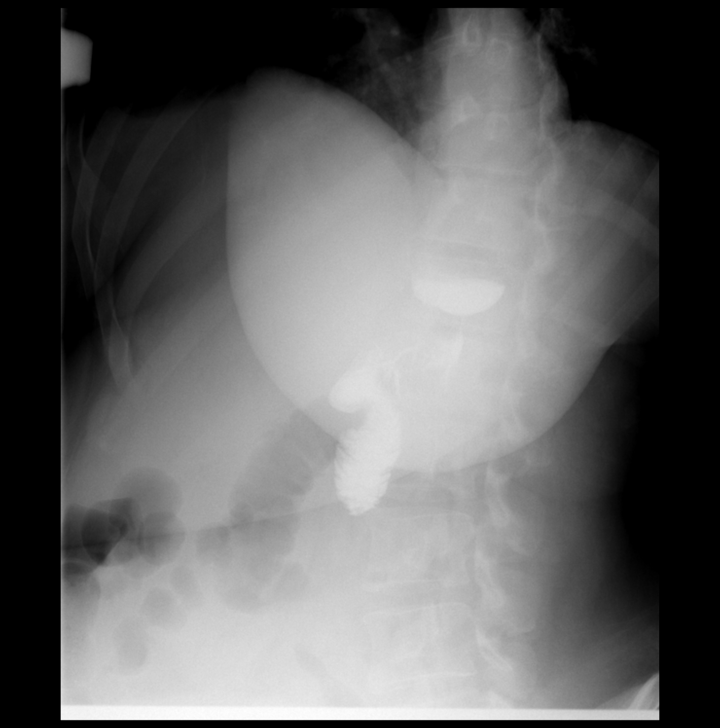

[Series 5: fluoro_barium 2fps_bw · 0.17mm/px · 2 of 4 frames shown (2 of 6)]
[frame 1/4]
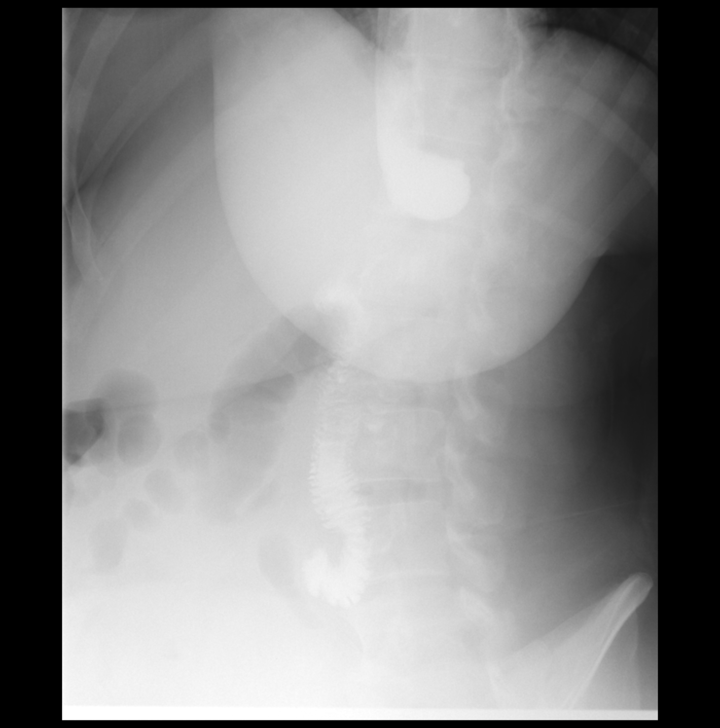
[frame 4/4]
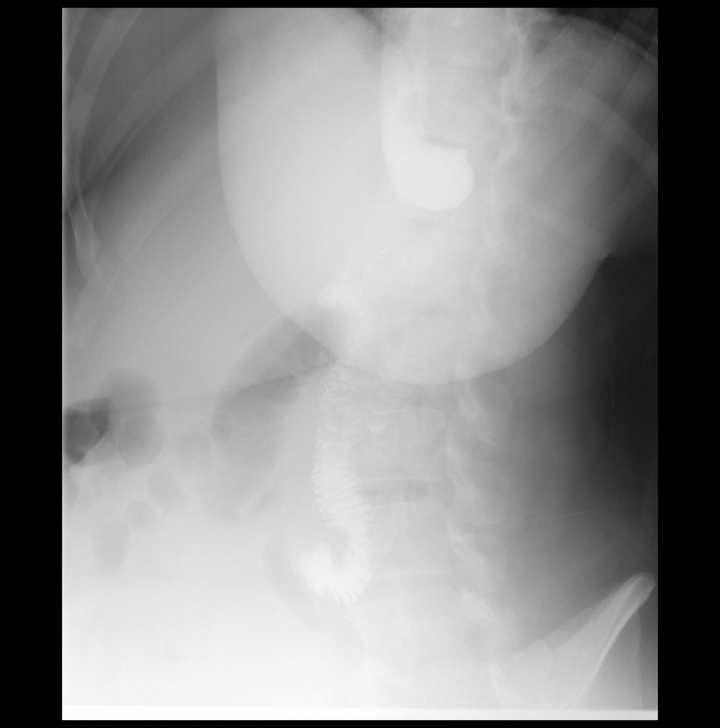

[Series 7: fluoro_barium 2fps_bw · 0.17mm/px · 2 of 4 frames shown (3 of 6)]
[frame 1/4]
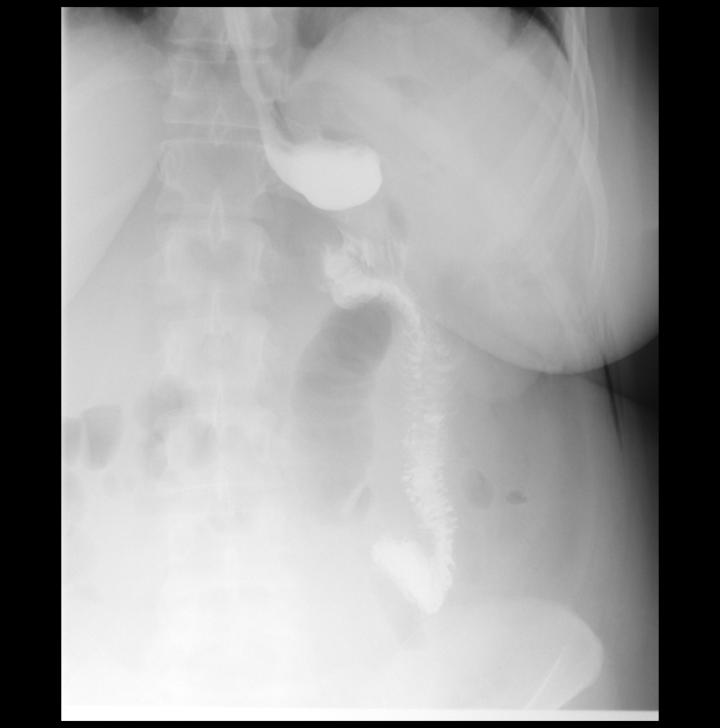
[frame 4/4]
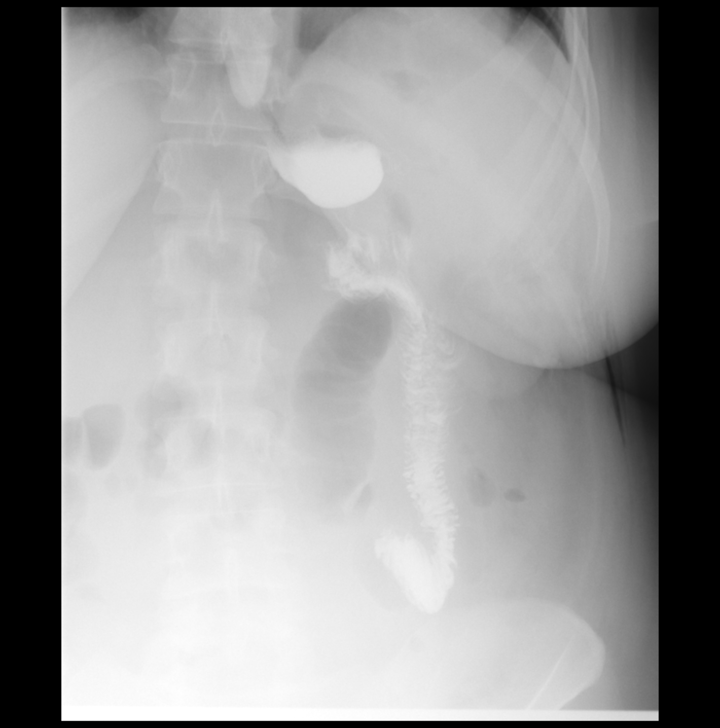

[Series 8: fluoro_barium 2fps_bw · 0.17mm/px · 1 of 2 frames shown (4 of 6)]
[frame 2/2]
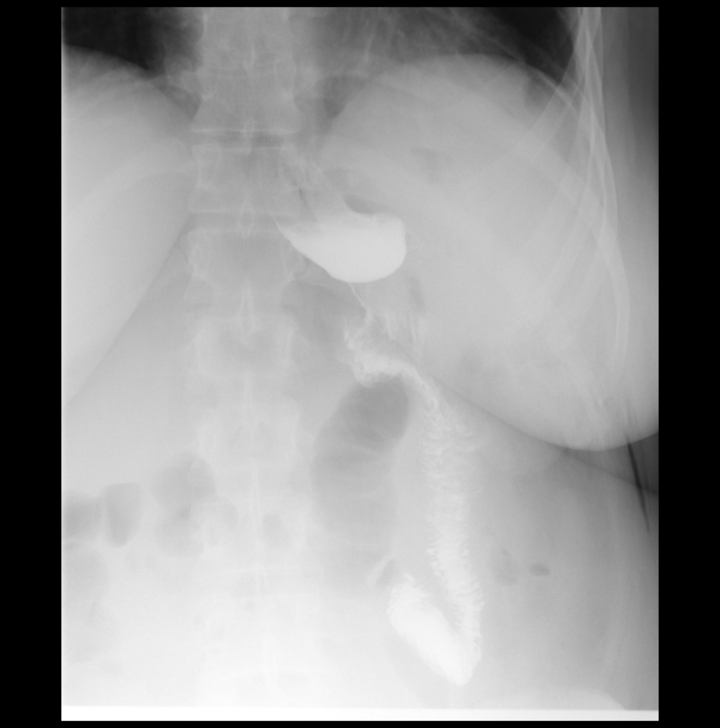

[Series 9: fluoro_barium 2fps_bw · 0.17mm/px · 1 of 4 frames shown (5 of 6)]
[frame 3/4]
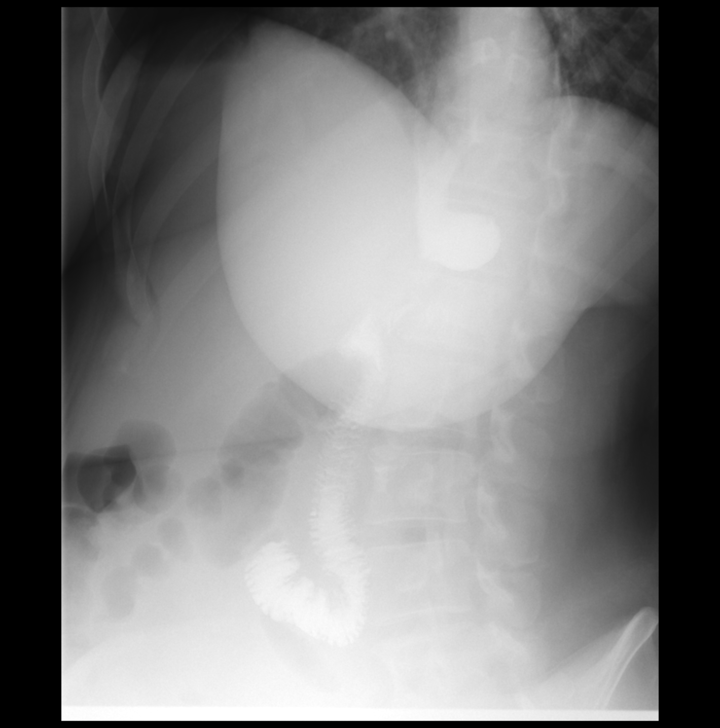

[Series 10: fluoro_barium 2fps_bw · 0.17mm/px · 2 of 3 frames shown (6 of 6)]
[frame 1/3]
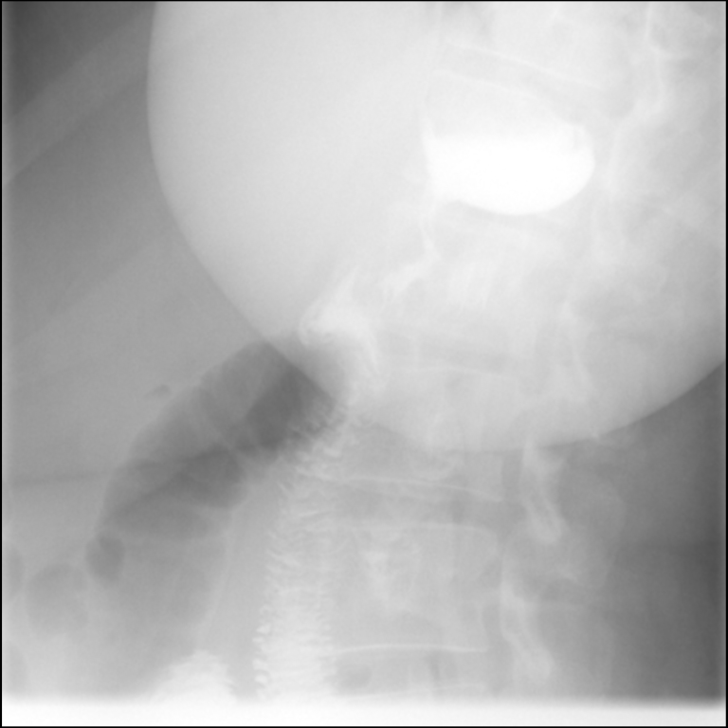
[frame 3/3]
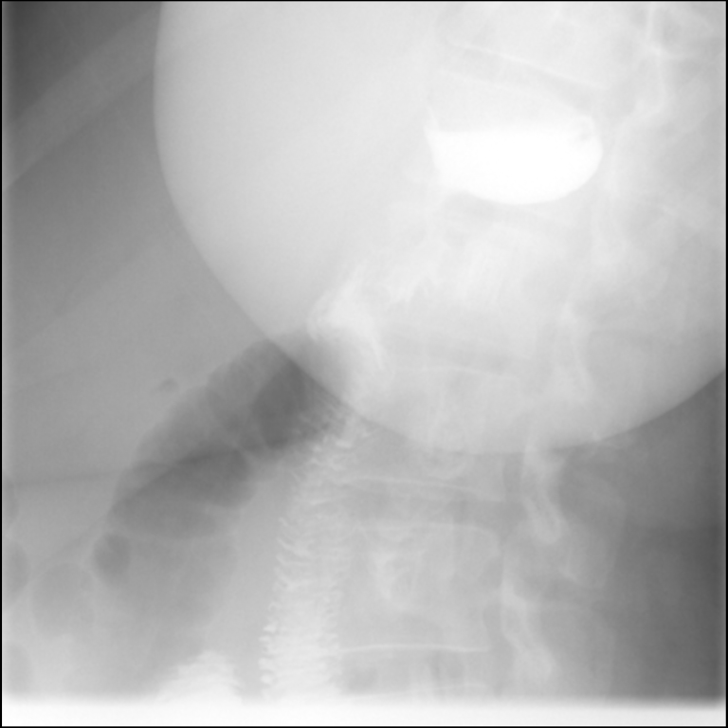

[12 of 23 positions shown; findings below may reference images not displayed]

FLUOROSCOPY TIME:  Radiation Exposure Index (as provided by the
fluoroscopic device):

If the device does not provide the exposure index:

Fluoroscopy Time (in minutes and seconds):  1 minutes

Number of Acquired Images:
FINDINGS: Postoperative appearance of the upper GI tract compatible with
Roux-en-Y gastric bypass surgery. No extravasation of contrast
material identified to suggest a leak. There is antegrade
progression of the contrast material through the gastric remnant
into the proximal small bowel.
IMPRESSION: 1. No extravasation of contrast material status post gastric bypass
surgery.

## 2016-12-18 IMAGING — US US ABDOMEN COMPLETE
1 series · 14 of 25 positions shown · non-contrast
Comparison: 05/25/2016

CLINICAL DATA: Upper abdominal pain, nausea, vomiting

EXAM:
ABDOMEN ULTRASOUND COMPLETE

[Series 1: us abdomen complete · 0.25mm/px · 14 of 78 slices shown]
[im 1/78]
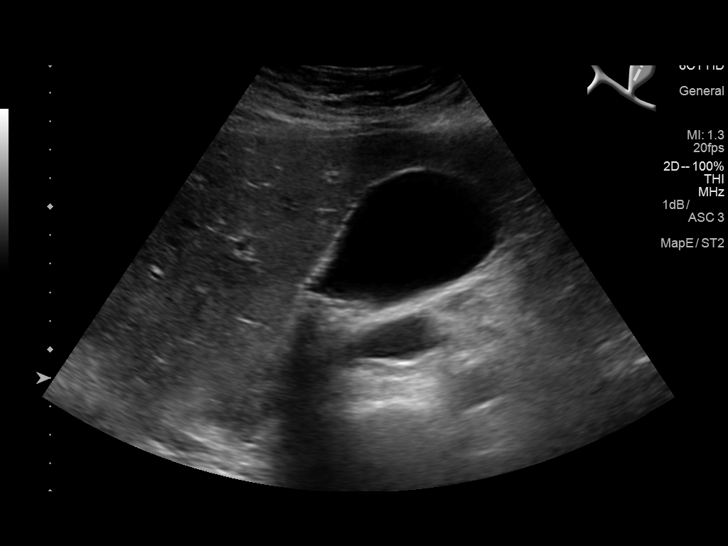
[im 7/78]
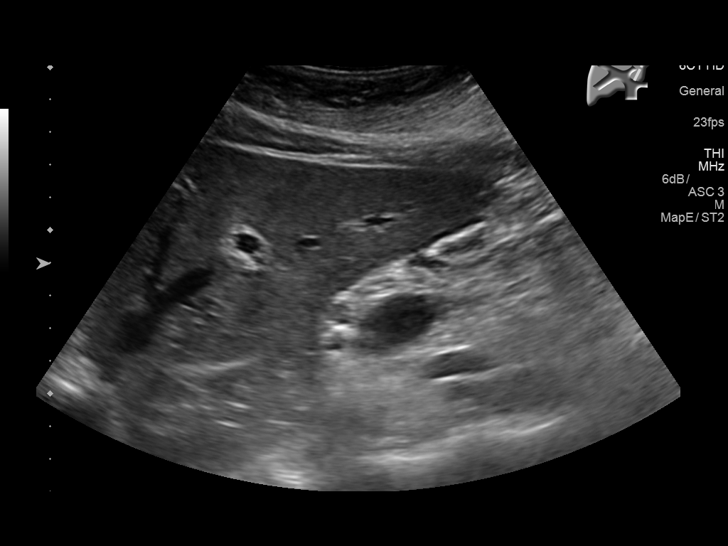
[im 13/78]
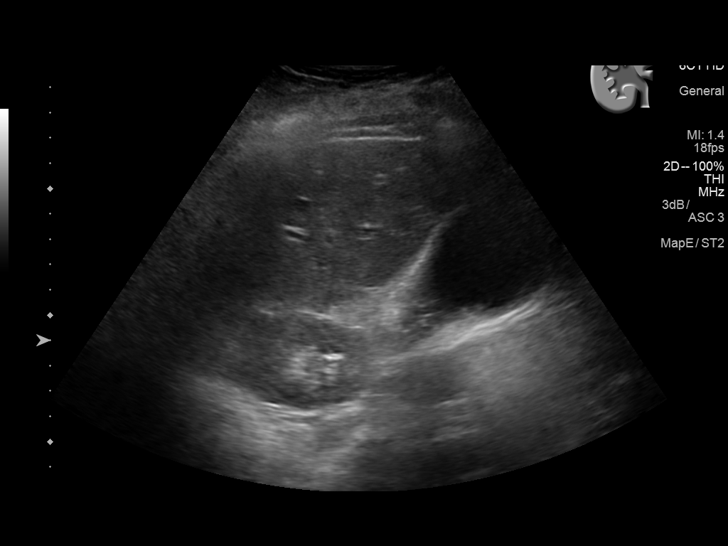
[im 20/78]
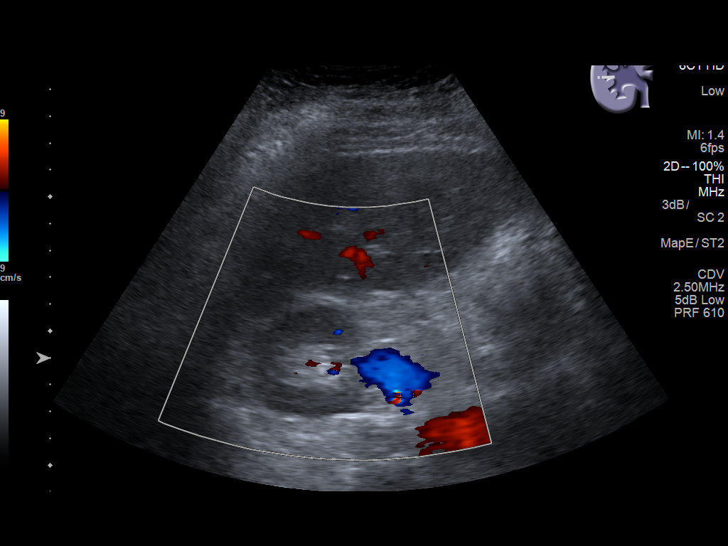
[im 26/78]
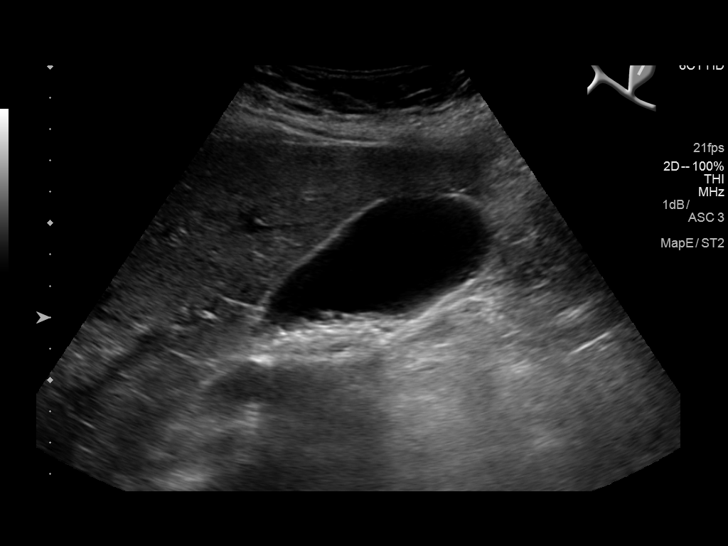
[im 29/78]
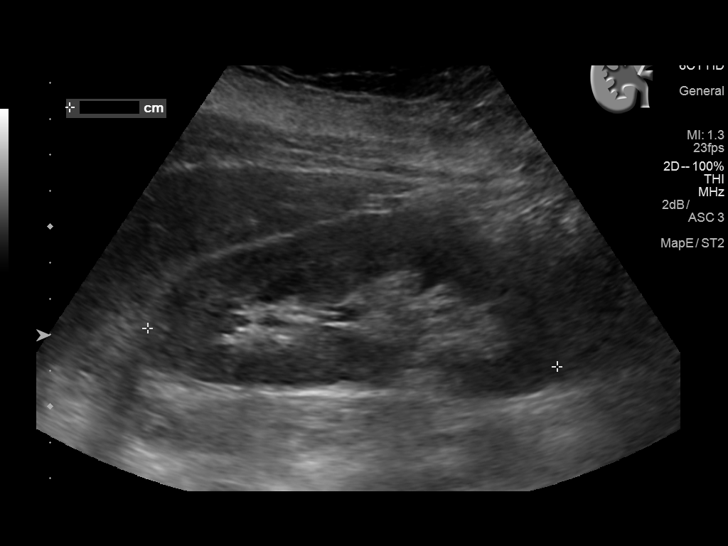
[im 36/78]
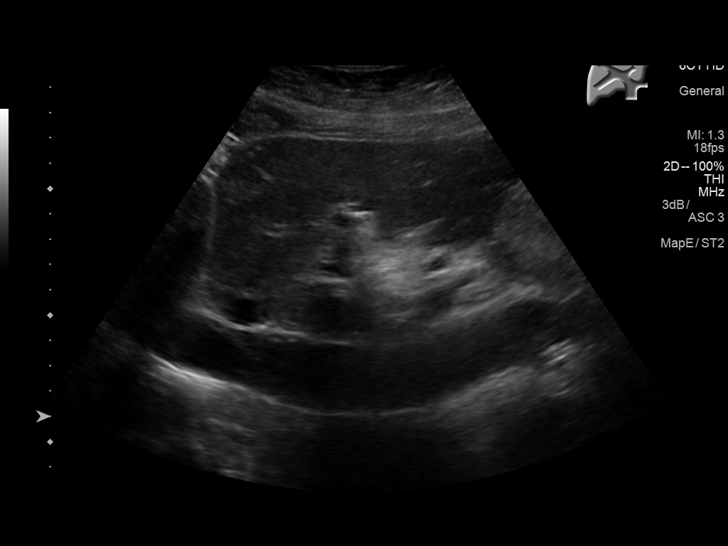
[im 42/78]
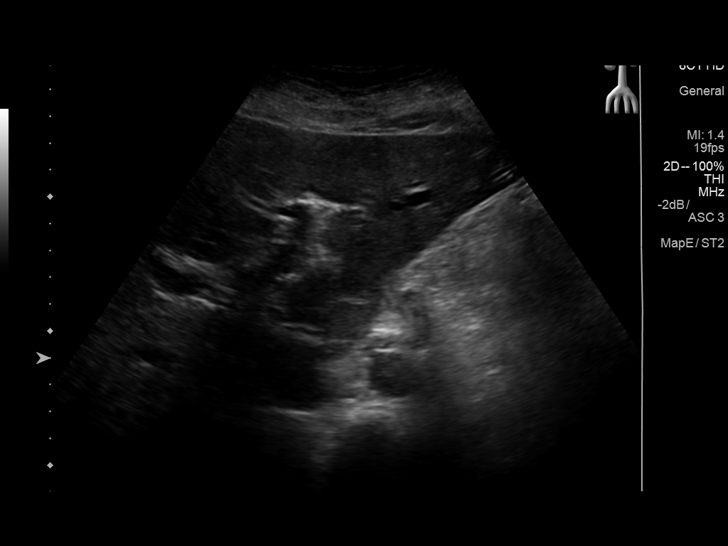
[im 49/78]
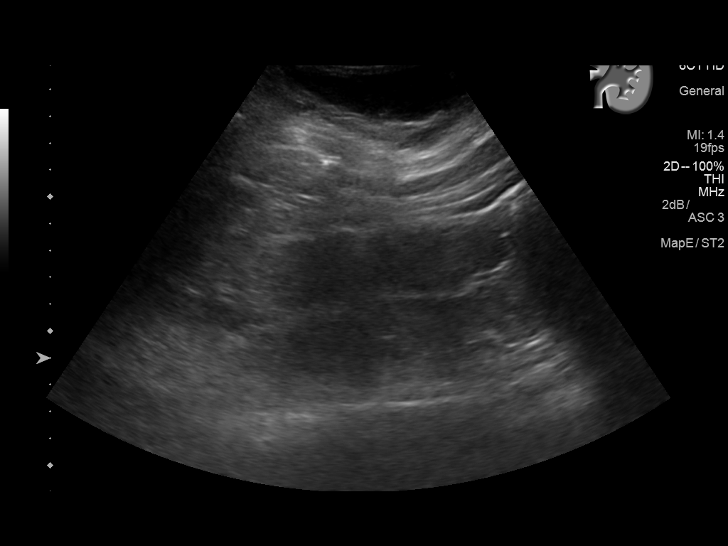
[im 52/78]
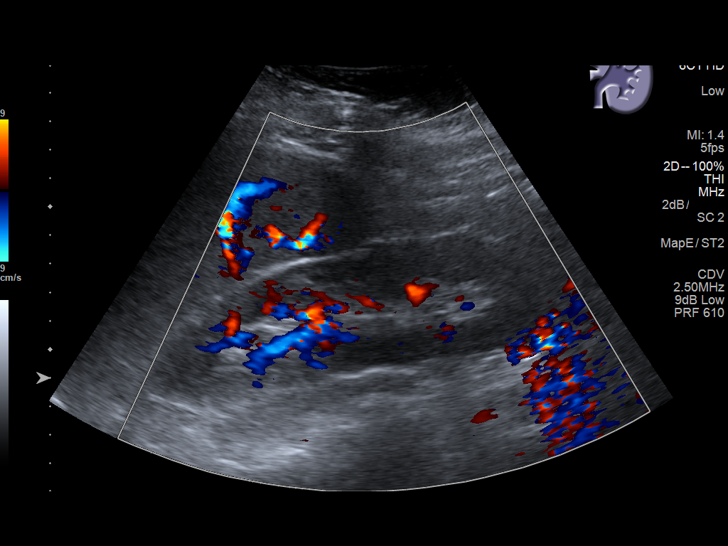
[im 58/78]
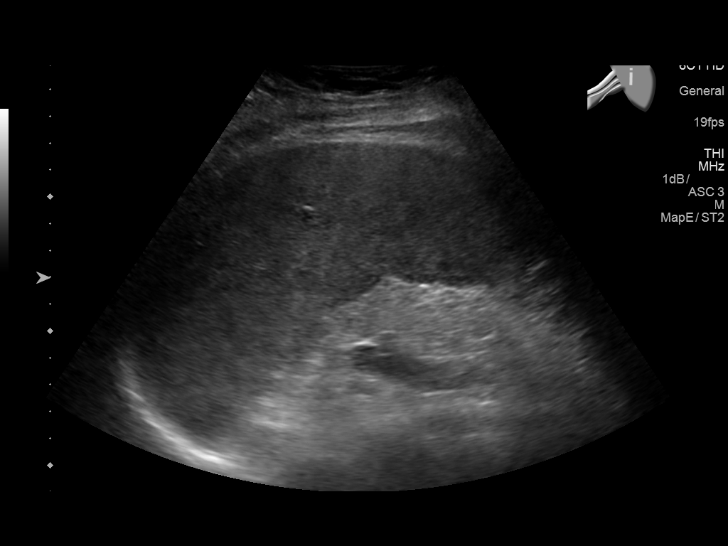
[im 65/78]
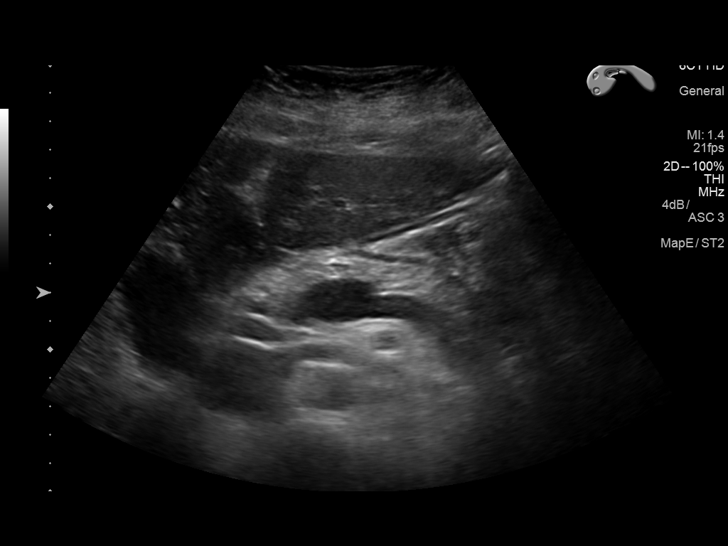
[im 71/78]
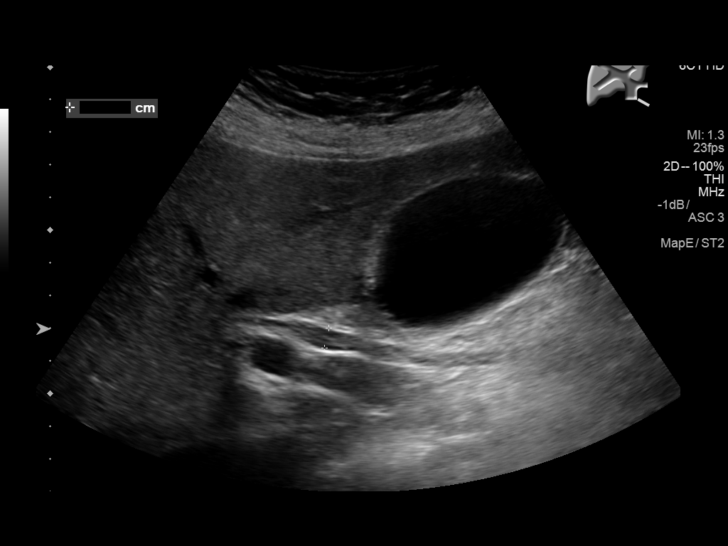
[im 78/78]
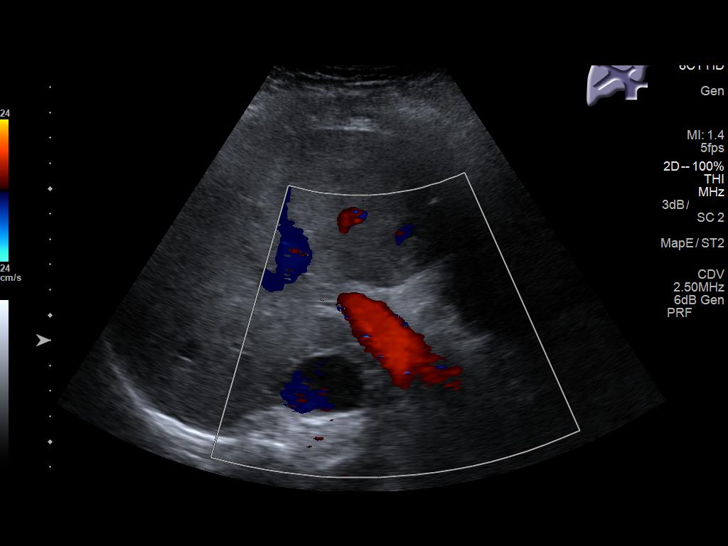

[14 of 25 positions shown; findings below may reference images not displayed]

FINDINGS: Gallbladder: Multiple small layering gallstones in the gallbladder,
the largest 6 mm. No wall thickening. Negative sonographic Harshal.

Common bile duct: Diameter: Normal caliber, 6 mm

Liver: No focal lesion identified. Within normal limits in
parenchymal echogenicity.

IVC: No abnormality visualized.

Pancreas: Visualized portion unremarkable.

Spleen: Enlarged with a craniocaudal length of 15.7 cm and a splenic
volume of 3337 cubic cm.

Right Kidney: Length: 11.4 cm. Echogenicity within normal limits. No
mass or hydronephrosis visualized.

Left Kidney: Length: 13.9 cm. Echogenicity within normal limits. No
mass or hydronephrosis visualized.

Abdominal aorta: No aneurysm visualized.

Other findings: None.
IMPRESSION: Cholelithiasis.  No sonographic evidence of acute cholecystitis.

Splenomegaly.

## 2017-05-29 ENCOUNTER — Encounter (HOSPITAL_COMMUNITY): Payer: Self-pay

## 2017-06-14 ENCOUNTER — Telehealth (HOSPITAL_COMMUNITY): Payer: Self-pay

## 2017-06-14 NOTE — Telephone Encounter (Signed)
This patient is overdue for recommended follow-up with a bariatric surgeon [facility]. A letter was mailed to the address on file 05/29/17 from both [employer] & [facility] in attempt to reestablish post-op care. The letter included a patient survey which was [facility] via mail today. Patient requested to be contacted via phone to schedule an appointment with [facility] surgeon. Scarlette CalicoFrances with [facility] called the patient on 05/29/17 as well at the same # requested by patient today on the survey & had left a voicemail message requesting a return call to their office.   Copy of the survey was shared with Dario GuardianFrances Jackson at [facility] today so she may contact patient again in attempt to schedule follow-up in their office.
